# Patient Record
Sex: Female | Born: 1990
Health system: Southern US, Community
[De-identification: ages and names within clinical notes are randomized; demographics above are authoritative.]

## PROBLEM LIST (undated history)

## (undated) DIAGNOSIS — K529 Noninfective gastroenteritis and colitis, unspecified: Secondary | ICD-10-CM

## (undated) DIAGNOSIS — F419 Anxiety disorder, unspecified: Secondary | ICD-10-CM

## (undated) DIAGNOSIS — G43909 Migraine, unspecified, not intractable, without status migrainosus: Secondary | ICD-10-CM

## (undated) DIAGNOSIS — G8929 Other chronic pain: Secondary | ICD-10-CM

## (undated) DIAGNOSIS — F909 Attention-deficit hyperactivity disorder, unspecified type: Secondary | ICD-10-CM

## (undated) DIAGNOSIS — K219 Gastro-esophageal reflux disease without esophagitis: Secondary | ICD-10-CM

## (undated) DIAGNOSIS — M1990 Unspecified osteoarthritis, unspecified site: Secondary | ICD-10-CM

## (undated) DIAGNOSIS — M419 Scoliosis, unspecified: Secondary | ICD-10-CM

## (undated) DIAGNOSIS — Z9889 Other specified postprocedural states: Secondary | ICD-10-CM

## (undated) DIAGNOSIS — D649 Anemia, unspecified: Secondary | ICD-10-CM

## (undated) DIAGNOSIS — R112 Nausea with vomiting, unspecified: Secondary | ICD-10-CM

## (undated) HISTORY — DX: Noninfective gastroenteritis and colitis, unspecified: K52.9

## (undated) HISTORY — DX: Other chronic pain: G89.29

## (undated) HISTORY — PX: BREAST SURGERY: SHX581

---
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
## 2012-07-09 ENCOUNTER — Emergency Department: Payer: Self-pay | Admitting: Emergency Medicine

## 2012-07-09 LAB — URINALYSIS, COMPLETE
Bilirubin,UR: NEGATIVE
Glucose,UR: NEGATIVE mg/dL (ref 0–75)
RBC,UR: 2 /HPF (ref 0–5)
Squamous Epithelial: 2
WBC UR: 6 /HPF (ref 0–5)

## 2012-07-09 LAB — CBC
MCH: 28.7 pg (ref 26.0–34.0)
MCHC: 32.6 g/dL (ref 32.0–36.0)
MCV: 88 fL (ref 80–100)
Platelet: 264 10*3/uL (ref 150–440)
RDW: 14.1 % (ref 11.5–14.5)
WBC: 14.3 10*3/uL — ABNORMAL HIGH (ref 3.6–11.0)

## 2012-07-09 LAB — COMPREHENSIVE METABOLIC PANEL
Albumin: 4 g/dL (ref 3.4–5.0)
Anion Gap: 5 — ABNORMAL LOW (ref 7–16)
Calcium, Total: 8.8 mg/dL (ref 8.5–10.1)
Chloride: 106 mmol/L (ref 98–107)
Co2: 28 mmol/L (ref 21–32)
EGFR (African American): >60
EGFR (Non-African Amer.): >60
Osmolality: 276 (ref 275–301)
Potassium: 3.2 mmol/L — ABNORMAL LOW (ref 3.5–5.1)
Sodium: 139 mmol/L (ref 136–145)

## 2012-07-09 LAB — PREGNANCY, URINE: Pregnancy Test, Urine: NEGATIVE m[IU]/mL

## 2012-07-09 LAB — MAGNESIUM: Magnesium: 2.1 mg/dL

## 2012-07-15 LAB — CULTURE, BLOOD (SINGLE)

## 2015-07-05 ENCOUNTER — Other Ambulatory Visit: Payer: Self-pay

## 2015-07-05 ENCOUNTER — Emergency Department (HOSPITAL_COMMUNITY)
Admission: EM | Admit: 2015-07-05 | Discharge: 2015-07-05 | Discharge status: Against Medical Advice | Payer: Medicaid Other | Attending: Emergency Medicine | Admitting: Emergency Medicine

## 2015-07-05 ENCOUNTER — Encounter (HOSPITAL_COMMUNITY): Payer: Self-pay | Admitting: Emergency Medicine

## 2015-07-05 DIAGNOSIS — Y939 Activity, unspecified: Secondary | ICD-10-CM | POA: Insufficient documentation

## 2015-07-05 DIAGNOSIS — Y929 Unspecified place or not applicable: Secondary | ICD-10-CM | POA: Diagnosis not present

## 2015-07-05 DIAGNOSIS — Y999 Unspecified external cause status: Secondary | ICD-10-CM | POA: Insufficient documentation

## 2015-07-05 DIAGNOSIS — T50901A Poisoning by unspecified drugs, medicaments and biological substances, accidental (unintentional), initial encounter: Secondary | ICD-10-CM | POA: Insufficient documentation

## 2015-07-05 HISTORY — DX: Attention-deficit hyperactivity disorder, unspecified type: F90.9

## 2016-02-16 ENCOUNTER — Encounter (HOSPITAL_COMMUNITY): Payer: Self-pay | Admitting: Emergency Medicine

## 2016-02-16 ENCOUNTER — Emergency Department (HOSPITAL_COMMUNITY): Payer: Medicaid Other

## 2016-02-16 ENCOUNTER — Emergency Department (HOSPITAL_COMMUNITY)
Admission: EM | Admit: 2016-02-16 | Discharge: 2016-02-16 | Disposition: A | Discharge status: Home / Self Care | Payer: Medicaid Other | Attending: Emergency Medicine | Admitting: Emergency Medicine

## 2016-02-16 DIAGNOSIS — Z8659 Personal history of other mental and behavioral disorders: Secondary | ICD-10-CM | POA: Insufficient documentation

## 2016-02-16 DIAGNOSIS — R102 Pelvic and perineal pain: Secondary | ICD-10-CM

## 2016-02-16 DIAGNOSIS — N83201 Unspecified ovarian cyst, right side: Secondary | ICD-10-CM | POA: Diagnosis not present

## 2016-02-16 DIAGNOSIS — R197 Diarrhea, unspecified: Secondary | ICD-10-CM | POA: Diagnosis not present

## 2016-02-16 DIAGNOSIS — Z3202 Encounter for pregnancy test, result negative: Secondary | ICD-10-CM | POA: Insufficient documentation

## 2016-02-16 DIAGNOSIS — M419 Scoliosis, unspecified: Secondary | ICD-10-CM | POA: Insufficient documentation

## 2016-02-16 DIAGNOSIS — R1031 Right lower quadrant pain: Secondary | ICD-10-CM | POA: Diagnosis present

## 2016-02-16 HISTORY — DX: Unspecified osteoarthritis, unspecified site: M19.90

## 2016-02-16 HISTORY — DX: Scoliosis, unspecified: M41.9

## 2016-02-16 LAB — URINALYSIS, ROUTINE W REFLEX MICROSCOPIC
Bilirubin Urine: NEGATIVE
GLUCOSE, UA: NEGATIVE mg/dL
Hgb urine dipstick: NEGATIVE
Ketones, ur: NEGATIVE mg/dL
LEUKOCYTES UA: NEGATIVE
Nitrite: NEGATIVE
PH: 8.5 — AB (ref 5.0–8.0)
Protein, ur: NEGATIVE mg/dL
SPECIFIC GRAVITY, URINE: 1.02 (ref 1.005–1.030)

## 2016-02-16 LAB — CBC
HEMATOCRIT: 39.6 % (ref 36.0–46.0)
Hemoglobin: 12.6 g/dL (ref 12.0–15.0)
MCH: 28.7 pg (ref 26.0–34.0)
MCHC: 31.8 g/dL (ref 30.0–36.0)
MCV: 90.2 fL (ref 78.0–100.0)
Platelets: 241 10*3/uL (ref 150–400)
RBC: 4.39 MIL/uL (ref 3.87–5.11)
RDW: 13.8 % (ref 11.5–15.5)
WBC: 5.2 10*3/uL (ref 4.0–10.5)

## 2016-02-16 LAB — COMPREHENSIVE METABOLIC PANEL
ALT: 11 U/L — ABNORMAL LOW (ref 14–54)
AST: 17 U/L (ref 15–41)
Albumin: 4.3 g/dL (ref 3.5–5.0)
Alkaline Phosphatase: 38 U/L (ref 38–126)
Anion gap: 10 (ref 5–15)
BUN: 10 mg/dL (ref 6–20)
CHLORIDE: 106 mmol/L (ref 101–111)
CO2: 26 mmol/L (ref 22–32)
Calcium: 9.3 mg/dL (ref 8.9–10.3)
Creatinine, Ser: 0.77 mg/dL (ref 0.44–1.00)
Glucose, Bld: 49 mg/dL — ABNORMAL LOW (ref 65–99)
POTASSIUM: 3.4 mmol/L — AB (ref 3.5–5.1)
SODIUM: 142 mmol/L (ref 135–145)
Total Bilirubin: 0.8 mg/dL (ref 0.3–1.2)
Total Protein: 7.4 g/dL (ref 6.5–8.1)

## 2016-02-16 LAB — CBG MONITORING, ED: Glucose-Capillary: 186 mg/dL — ABNORMAL HIGH (ref 65–99)

## 2016-02-16 LAB — I-STAT BETA HCG BLOOD, ED (MC, WL, AP ONLY): I-stat hCG, quantitative: <5 m[IU]/mL (ref <5)

## 2016-02-16 LAB — LIPASE, BLOOD: LIPASE: 35 U/L (ref 11–51)

## 2016-02-16 MED ORDER — HYDROMORPHONE HCL 1 MG/ML IJ SOLN
1.0000 mg | Freq: Once | INTRAMUSCULAR | Status: AC
  Administered 2016-02-16: 1 mg via INTRAVENOUS
  Filled 2016-02-16: qty 1

## 2016-02-16 MED ORDER — SODIUM CHLORIDE 0.9 % IV BOLUS (SEPSIS)
1000.0000 mL | Freq: Once | INTRAVENOUS | Status: AC
  Administered 2016-02-16: 1000 mL via INTRAVENOUS

## 2016-02-16 MED ORDER — MORPHINE SULFATE (PF) 4 MG/ML IV SOLN
4.0000 mg | Freq: Once | INTRAVENOUS | Status: AC
  Administered 2016-02-16: 4 mg via INTRAVENOUS
  Filled 2016-02-16: qty 1

## 2016-02-16 MED ORDER — IOPAMIDOL (ISOVUE-300) INJECTION 61%
INTRAVENOUS | Status: AC
  Administered 2016-02-16: 80 mL
  Filled 2016-02-16: qty 100

## 2016-02-16 MED ORDER — SODIUM CHLORIDE 0.9 % IV BOLUS (SEPSIS): 1000.0000 mL | Freq: Once | INTRAVENOUS | Status: DC

## 2016-02-16 MED ORDER — DOXYCYCLINE HYCLATE 100 MG PO CAPS: 100.0000 mg | ORAL_CAPSULE | Freq: Two times a day (BID) | ORAL | Status: DC

## 2016-02-16 MED ORDER — IOPAMIDOL (ISOVUE-370) INJECTION 76%
INTRAVENOUS | Status: AC
  Filled 2016-02-16: qty 100

## 2016-02-16 MED ORDER — DOXYCYCLINE HYCLATE 100 MG PO TABS
100.0000 mg | ORAL_TABLET | Freq: Once | ORAL | Status: AC
  Administered 2016-02-16: 100 mg via ORAL
  Filled 2016-02-16: qty 1

## 2016-02-16 MED ORDER — DEXTROSE 5 % IV BOLUS: 1000.0000 mL | Freq: Once | INTRAVENOUS | Status: DC

## 2016-02-16 MED ORDER — OXYCODONE-ACETAMINOPHEN 5-325 MG PO TABS: 1.0000 | ORAL_TABLET | Freq: Four times a day (QID) | ORAL | Status: DC | PRN

## 2016-02-16 MED ORDER — DIATRIZOATE MEGLUMINE & SODIUM 66-10 % PO SOLN
ORAL | Status: AC
Start: 2016-02-16 — End: 2016-02-16
  Administered 2016-02-16: 30 mL
  Filled 2016-02-16: qty 30

## 2016-02-16 MED ORDER — DEXTROSE 50 % IV SOLN
1.0000 | Freq: Once | INTRAVENOUS | Status: AC
  Administered 2016-02-16: 50 mL via INTRAVENOUS
  Filled 2016-02-16: qty 50

## 2016-02-16 MED ORDER — KETOROLAC TROMETHAMINE 30 MG/ML IJ SOLN
30.0000 mg | Freq: Once | INTRAMUSCULAR | Status: AC
  Administered 2016-02-16: 30 mg via INTRAVENOUS
  Filled 2016-02-16: qty 1

## 2016-02-16 NOTE — ED Provider Notes (Signed)
Medical screening examination/treatment/procedure(s) were conducted as a shared visit with non-physician practitioner(s) and myself.  I personally evaluated the patient during the encounter.   EKG Interpretation None      25 year old female who presents with right lower quadrant abdominal pain. History of ADHD and no prior abdominal surgeries. One week ago noticed a sharp pain in her right lower quadrant that self resolved. Over the past 2 days she has had increasing episodes of sharp intermittent right lower quadrant abdominal pain. No nausea or vomiting, diarrhea, fevers or chills. Vital signs within normal limits on arrival. She is a soft and nonsurgical abdomen with exquisite tenderness in the right adnexa. CT abdomen and pelvis was initially performed revealing 5 x 5 x 5 cm right ovarian cyst. she will undergo pelvic ultrasound with Doppler to evaluate for torsion. If pain well controlled and no concern for torsion may be discharged with gynecology follow-up.   Lavera Guiseana Duo Jameil Whitmoyer, MD 02/16/16 [phone]

## 2016-02-16 NOTE — ED Provider Notes (Signed)
CSN: [id number]     Arrival date & time 02/16/16  1209 History   First MD Initiated Contact with Patient 02/16/16 1522     Chief Complaint  Patient presents with  . Abdominal Pain   HPI Comments: 25 year old female who presents with right lower quadrant abdominal pain for the past week. The pain is constant, aching, worse with movement. She reports associated body chills, nausea, vomiting, diarrhea. Denies fever, chest pain, shortness of breath, blood in stool or urine, dysuria, vaginal discharge. She is currently on birth control, uses Nexplanon. Has regular periods. Denies previous abdominal surgeries. Patient has developmental delays and is unable to provide an accurate history.   Past Medical History  Diagnosis Date  . ADHD (attention deficit hyperactivity disorder)   . Arthritis   . Scoliosis    Past Surgical History  Procedure Laterality Date  . Breast surgery Right     Cyst removal   History reviewed. No pertinent family history. Social History  Substance Use Topics  . Smoking status: Never Smoker   . Smokeless tobacco: None  . Alcohol Use: No   OB History    No data available     Review of Systems  Constitutional: Positive for chills and appetite change. Negative for fever.  Gastrointestinal: Positive for nausea, vomiting, abdominal pain and diarrhea. Negative for constipation.  Genitourinary: Negative for dysuria, vaginal bleeding, vaginal discharge and vaginal pain.  All other systems reviewed and are negative.  Allergies  Review of patient's allergies indicates no known allergies.  Home Medications   Prior to Admission medications   Not on File   BP 121/69 mmHg  Pulse 81  Temp(Src) 98.3 F (36.8 C) (Oral)  Resp 17  SpO2 99%  LMP 02/09/2016 (Approximate)   Physical Exam  Constitutional: She is oriented to person, place, and time. She appears well-developed and well-nourished. No distress.  HENT:  Head: Normocephalic and atraumatic.  Eyes:  Conjunctivae are normal. Pupils are equal, round, and reactive to light. Right eye exhibits no discharge. Left eye exhibits no discharge. No scleral icterus.  Neck: Normal range of motion.  Cardiovascular: Normal rate and regular rhythm.  Exam reveals no gallop and no friction rub.   No murmur heard. Pulmonary/Chest: Effort normal. No respiratory distress. She has no wheezes. She has no rales. She exhibits no tenderness.  Abdominal: Soft. She exhibits no distension and no mass. There is tenderness. There is guarding. There is no rebound.  Marked tenderness over suprapubic and RLQ  Neurological: She is alert and oriented to person, place, and time.  Skin: Skin is warm and dry.  Psychiatric: She has a normal mood and affect.    ED Course  Procedures (including critical care time) Labs Review Labs Reviewed  COMPREHENSIVE METABOLIC PANEL - Abnormal; Notable for the following:    Potassium 3.4 (*)    Glucose, Bld 49 (*)    ALT 11 (*)    All other components within normal limits  URINALYSIS, ROUTINE W REFLEX MICROSCOPIC (NOT AT [facility]) - Abnormal; Notable for the following:    APPearance CLOUDY (*)    pH 8.5 (*)    All other components within normal limits  LIPASE, BLOOD  CBC  I-STAT BETA HCG BLOOD, ED ([facility], [facility], [facility] ONLY)  CBG MONITORING, ED    Imaging Review Ct Abdomen Pelvis W Contrast  02/16/2016  CLINICAL DATA:  Ight lower quadrant pain with intermittent vomiting for 2 days EXAM: CT ABDOMEN AND PELVIS WITH CONTRAST TECHNIQUE: Multidetector CT imaging  of the abdomen and pelvis was performed using the standard protocol following bolus administration of intravenous contrast. Oral contrast was also administered. CONTRAST:  80mL ISOVUE-300 IOPAMIDOL (ISOVUE-300) INJECTION 61% COMPARISON:  Ultrasound pelvis July 09, 2012 FINDINGS: Lower chest:  Lung bases are clear. Hepatobiliary: No focal liver lesions are identified. Gallbladder wall is not appreciably thickened. There is no biliary duct  dilatation. Pancreas: There is no pancreatic mass or inflammatory focus. Spleen: No splenic lesions are evident. Adrenals/Urinary Tract: No adrenal lesions are identified. Kidneys bilaterally show no mass or hydronephrosis on either side. There is no renal or ureteral calculus on either side. Urinary bladder is midline with wall thickness within normal limits. Stomach/Bowel: There is no bowel wall or mesenteric thickening. No bowel obstruction. No free air or portal venous air. Rectum is mildly distended with stool and air. Vascular/Lymphatic: There is no abdominal aortic aneurysm. No vascular lesions are evident. There is no adenopathy in the abdomen or pelvis. Reproductive: The uterus is anteverted. There is a predominantly cystic right adnexal mass measuring 5.6 x 5.1 x 5.3 cm. There is layering debris in this cystic lesion. The wall of this cystic lesion is not appreciably thickened. There is no other pelvic mass. Other: Appendix appears unremarkable. There is no ascites or abscess in the abdomen or pelvis. Musculoskeletal: There is thoracolumbar dextroscoliosis. There are no blastic or lytic bone lesions. There is no intramuscular or abdominal wall lesion. IMPRESSION: Right adnexal cystic lesion with layering debris, measuring 5.6 x 5.1 x 5.3 cm. Question hemorrhagic cyst versus cyst with infection. No other pelvic mass. Note that the patient had a right ovarian cyst in 2013. Question whether this cyst represents the same cyst, now with layering debris versus a new cystic lesion in this area compared to the prior study. With respect to the current right adnexal complex cystic lesion, ultrasound in 6-12 weeks to further evaluate this lesion would be warranted. Timing of ultrasound ideally would be at the end of the patient's menstrual cycle. Appendix region appears normal. No bowel obstruction. No renal or ureteral calculus. No hydronephrosis. Electronically Signed   By: Bretta Bang III M.D.   On:  02/16/2016 19:38   I have personally reviewed and evaluated these images and lab results as part of my medical decision-making.   EKG Interpretation None     Meds given in ED:  Medications  HYDROmorphone (DILAUDID) injection 1 mg (not administered)  dextrose 50 % solution 50 mL (not administered)  morphine 4 MG/ML injection 4 mg (4 mg Intravenous Given 02/16/16 1613)  sodium chloride 0.9 % bolus 1,000 mL (0 mLs Intravenous Stopped 02/16/16 1750)  diatrizoate meglumine-sodium (GASTROGRAFIN) 66-10 % solution (30 mLs  Given 02/16/16 1609)  HYDROmorphone (DILAUDID) injection 1 mg (1 mg Intravenous Given 02/16/16 1751)  iopamidol (ISOVUE-300) 61 % injection (80 mLs  Contrast Given 02/16/16 1911)    New Prescriptions   No medications on file     MDM   Final diagnoses:  None   25 year old female who presents with 1 week of RLQ abdominal pain. Ct of abdomen is showing a right adnexal cyst which is 5cm x 5cm x 5cm.  IVF and Morphine given which has provided minimal pain relief.  Dilaudid given x 2 which has provided slightly more relief. Due to her pain being relatively uncontrolled with pain meds, will get pelvic US with doppler to r/o Torsion. Labs show Glucose 49. Will give amp of D50. Otherwise labs are unremarkable.   Patient is  afebrile, NAD, non-toxic. Shared visit with Dr. Verdie Mosher. Will sign out to [employer] PA-C    Bethel Born, PA-C 02/16/16 2051  Lavera Guise, MD 02/16/16 [phone]

## 2016-02-16 NOTE — ED Provider Notes (Signed)
Patient to the ER with complaints of RLQ abdominal pain that has been persisting for 1 week, over the past two days it has gotten much worse. Per mom the patient has a low IQ.  She has seen originally by Terance Hart, PA-C and signed out at shift change.  Labs Reviewed  COMPREHENSIVE METABOLIC PANEL - Abnormal; Notable for the following:    Potassium 3.4 (*)    Glucose, Bld 49 (*)    ALT 11 (*)    All other components within normal limits  URINALYSIS, ROUTINE W REFLEX MICROSCOPIC (NOT AT [facility]) - Abnormal; Notable for the following:    APPearance CLOUDY (*)    pH 8.5 (*)    All other components within normal limits  CBG MONITORING, ED - Abnormal; Notable for the following:    Glucose-Capillary 186 (*)    All other components within normal limits  LIPASE, BLOOD  CBC  I-STAT BETA HCG BLOOD, ED ([facility], [facility], [facility] ONLY)       US Pelvis Complete (Final result) Result time: 02/16/16 21:13:03   Final result by Rad Results In Interface (02/16/16 21:13:03)   Narrative:   CLINICAL DATA: Right-sided pelvic pain for 2 days. Abnormal CT.  EXAM: TRANSABDOMINAL ULTRASOUND OF PELVIS  DOPPLER ULTRASOUND OF OVARIES  TECHNIQUE: Transabdominal ultrasound examination of the pelvis was performed including evaluation of the uterus, ovaries, adnexal regions, and pelvic cul-de-sac.  Color and duplex Doppler ultrasound was utilized to evaluate blood flow to the ovaries.  COMPARISON: CT 02/16/2016  FINDINGS: Uterus  Measurements: 7.9 x 4.0 x 5.4 cm. No fibroids or other mass visualized.  Endometrium  Thickness: 4.2 mm. No focal abnormality visualized.  Right ovary  Measurements: 5.5 x 4.5 x 5.5 cm. There is a complex 4.8 cm cyst with fluid fluid level, corresponding to the CT abnormality. This probably is hemorrhagic. No solid adnexal lesions are evident. There is no free pelvic fluid.  Left ovary  Not visible  Pulsed Doppler evaluation demonstrates normal low-resistance arterial and  venous waveforms in both ovaries.  IMPRESSION: 4.8 cm right adnexal cyst with fluid fluid level, likely hemorrhagic. Nonvisualization of the left ovary. With regard to the right ovarian lesion, recommend 6-12 week follow-up ultrasound to ensure resolution.   Electronically Signed By: Ellery Plunk M.D. On: 02/16/2016 21:13          [place] Art/Ven Flow Abd Pelv Doppler Limited (Final result) Result time: 02/16/16 21:13:03   Final result by Rad Results In Interface (02/16/16 21:13:03)   Narrative:   CLINICAL DATA: Right-sided pelvic pain for 2 days. Abnormal CT.  EXAM: TRANSABDOMINAL ULTRASOUND OF PELVIS  DOPPLER ULTRASOUND OF OVARIES  TECHNIQUE: Transabdominal ultrasound examination of the pelvis was performed including evaluation of the uterus, ovaries, adnexal regions, and pelvic cul-de-sac.  Color and duplex Doppler ultrasound was utilized to evaluate blood flow to the ovaries.  COMPARISON: CT 02/16/2016  FINDINGS: Uterus  Measurements: 7.9 x 4.0 x 5.4 cm. No fibroids or other mass visualized.  Endometrium  Thickness: 4.2 mm. No focal abnormality visualized.  Right ovary  Measurements: 5.5 x 4.5 x 5.5 cm. There is a complex 4.8 cm cyst with fluid fluid level, corresponding to the CT abnormality. This probably is hemorrhagic. No solid adnexal lesions are evident. There is no free pelvic fluid.  Left ovary  Not visible  Pulsed Doppler evaluation demonstrates normal low-resistance arterial and venous waveforms in both ovaries.  IMPRESSION: 4.8 cm right adnexal cyst with fluid fluid level, likely hemorrhagic. Nonvisualization of the  left ovary. With regard to the right ovarian lesion, recommend 6-12 week follow-up ultrasound to ensure resolution.   Electronically Signed By: Ellery Plunk M.D. On: 02/16/2016 21:13          CT Abdomen Pelvis W Contrast (Final result) Result time: 02/16/16 19:38:29   Final result by Rad  Results In Interface (02/16/16 19:38:29)   Narrative:   CLINICAL DATA: Ight lower quadrant pain with intermittent vomiting for 2 days  EXAM: CT ABDOMEN AND PELVIS WITH CONTRAST  TECHNIQUE: Multidetector CT imaging of the abdomen and pelvis was performed using the standard protocol following bolus administration of intravenous contrast. Oral contrast was also administered.  CONTRAST: 80mL ISOVUE-300 IOPAMIDOL (ISOVUE-300) INJECTION 61%  COMPARISON: Ultrasound pelvis July 09, 2012  FINDINGS: Lower chest: Lung bases are clear.  Hepatobiliary: No focal liver lesions are identified. Gallbladder wall is not appreciably thickened. There is no biliary duct dilatation.  Pancreas: There is no pancreatic mass or inflammatory focus.  Spleen: No splenic lesions are evident.  Adrenals/Urinary Tract: No adrenal lesions are identified. Kidneys bilaterally show no mass or hydronephrosis on either side. There is no renal or ureteral calculus on either side. Urinary bladder is midline with wall thickness within normal limits.  Stomach/Bowel: There is no bowel wall or mesenteric thickening. No bowel obstruction. No free air or portal venous air. Rectum is mildly distended with stool and air.  Vascular/Lymphatic: There is no abdominal aortic aneurysm. No vascular lesions are evident. There is no adenopathy in the abdomen or pelvis.  Reproductive: The uterus is anteverted. There is a predominantly cystic right adnexal mass measuring 5.6 x 5.1 x 5.3 cm. There is layering debris in this cystic lesion. The wall of this cystic lesion is not appreciably thickened. There is no other pelvic mass.  Other: Appendix appears unremarkable. There is no ascites or abscess in the abdomen or pelvis.  Musculoskeletal: There is thoracolumbar dextroscoliosis. There are no blastic or lytic bone lesions. There is no intramuscular or abdominal wall lesion.  IMPRESSION: Right adnexal cystic lesion  with layering debris, measuring 5.6 x 5.1 x 5.3 cm. Question hemorrhagic cyst versus cyst with infection. No other pelvic mass. Note that the patient had a right ovarian cyst in 2013. Question whether this cyst represents the same cyst, now with layering debris versus a new cystic lesion in this area compared to the prior study.  With respect to the current right adnexal complex cystic lesion, ultrasound in 6-12 weeks to further evaluate this lesion would be warranted. Timing of ultrasound ideally would be at the end of the patient's menstrual cycle.  Appendix region appears normal. No bowel obstruction. No renal or ureteral calculus. No hydronephrosis.   Electronically Signed By: Bretta Bang III M.D. On: 02/16/2016 19:38        ECG Results    None     Her CT abd/pelv showed abnormalities to the ovaries but otherwise normal. A US duplex of the ovaries for further evaluation was ordered. The US shows Right adnexal cystic lesion with layering debris, measuring 5.6 x 5.1 x 5.3 cm.  4.8 cm right adnexal cyst with fluid fluid level, likely hemorrhagic. Nonvisualization of the left ovary. With regard to the right ovarian lesion, recommend 6-12 week follow-up ultrasound to ensure resolution.  A pelvic exam was not done during this patients encounter and the mom now refuses as she says she would like to go home because it has been a long day. CT scan had mentioned concerned for possible infectious  etiology. Per the patients symptoms and US this does not seem consistent with infection. However, will start her on doxycyline and pain medication and refer to womens outpatient clinic.  Strict return precautions given.  Marlon Peliffany Omaria Plunk, PA-C 02/16/16 2130  Lavera Guiseana Duo Liu, MD 02/16/16 [phone]

## 2016-02-16 NOTE — Discharge Instructions (Signed)
Ovarian Cyst An ovarian cyst is a fluid-filled sac that forms on an ovary. The ovaries are small organs that produce eggs in women. Various types of cysts can form on the ovaries. Most are not cancerous. Many do not cause problems, and they often go away on their own. Some may cause symptoms and require treatment. Common types of ovarian cysts include:  Functional cysts--These cysts may occur every month during the menstrual cycle. This is normal. The cysts usually go away with the next menstrual cycle if the woman does not get pregnant. Usually, there are no symptoms with a functional cyst.  Endometrioma cysts--These cysts form from the tissue that lines the uterus. They are also called "chocolate cysts" because they become filled with blood that turns brown. This type of cyst can cause pain in the lower abdomen during intercourse and with your menstrual period.  Cystadenoma cysts--This type develops from the cells on the outside of the ovary. These cysts can get very big and cause lower abdomen pain and pain with intercourse. This type of cyst can twist on itself, cut off its blood supply, and cause severe pain. It can also easily rupture and cause a lot of pain.  Dermoid cysts--This type of cyst is sometimes found in both ovaries. These cysts may contain different kinds of body tissue, such as skin, teeth, hair, or cartilage. They usually do not cause symptoms unless they get very big.  Theca lutein cysts--These cysts occur when too much of a certain hormone (human chorionic gonadotropin) is produced and overstimulates the ovaries to produce an egg. This is most common after procedures used to assist with the conception of a baby (in vitro fertilization). CAUSES   Fertility drugs can cause a condition in which multiple large cysts are formed on the ovaries. This is called ovarian hyperstimulation syndrome.  A condition called polycystic ovary syndrome can cause hormonal imbalances that can lead to  nonfunctional ovarian cysts. SIGNS AND SYMPTOMS  Many ovarian cysts do not cause symptoms. If symptoms are present, they may include:  Pelvic pain or pressure.  Pain in the lower abdomen.  Pain during sexual intercourse.  Increasing girth (swelling) of the abdomen.  Abnormal menstrual periods.  Increasing pain with menstrual periods.  Stopping having menstrual periods without being pregnant. DIAGNOSIS  These cysts are commonly found during a routine or annual pelvic exam. Tests may be ordered to find out more about the cyst. These tests may include:  Ultrasound.  X-ray of the pelvis.  CT scan.  MRI.  Blood tests. TREATMENT  Many ovarian cysts go away on their own without treatment. Your health care provider may want to check your cyst regularly for 2-3 months to see if it changes. For women in menopause, it is particularly important to monitor a cyst closely because of the higher rate of ovarian cancer in menopausal women. When treatment is needed, it may include any of the following:  A procedure to drain the cyst (aspiration). This may be done using a long needle and ultrasound. It can also be done through a laparoscopic procedure. This involves using a thin, lighted tube with a tiny camera on the end (laparoscope) inserted through a small incision.  Surgery to remove the whole cyst. This may be done using laparoscopic surgery or an open surgery involving a larger incision in the lower abdomen.  Hormone treatment or birth control pills. These methods are sometimes used to help dissolve a cyst. HOME CARE INSTRUCTIONS   Only take over-the-counter   or prescription medicines as directed by your health care provider.  Follow up with your health care provider as directed.  Get regular pelvic exams and Pap tests. SEEK MEDICAL CARE IF:   Your periods are late, irregular, or painful, or they stop.  Your pelvic pain or abdominal pain does not go away.  Your abdomen becomes  larger or swollen.  You have pressure on your bladder or trouble emptying your bladder completely.  You have pain during sexual intercourse.  You have feelings of fullness, pressure, or discomfort in your stomach.  You lose weight for no apparent reason.  You feel generally ill.  You become constipated.  You lose your appetite.  You develop acne.  You have an increase in body and facial hair.  You are gaining weight, without changing your exercise and eating habits.  You think you are pregnant. SEEK IMMEDIATE MEDICAL CARE IF:   You have increasing abdominal pain.  You feel sick to your stomach (nauseous), and you throw up (vomit).  You develop a fever that comes on suddenly.  You have abdominal pain during a bowel movement.  Your menstrual periods become heavier than usual. MAKE SURE YOU:  Understand these instructions.  Will watch your condition.  Will get help right away if you are not doing well or get worse.   This information is not intended to replace advice given to you by your health care provider. Make sure you discuss any questions you have with your health care provider.   Document Released: 10/25/2005 Document Revised: 10/30/2013 Document Reviewed: 07/02/2013 Elsevier Interactive Patient Education 2016 [employer]  

## 2016-02-16 NOTE — ED Notes (Signed)
Pt with RLQ pain x 2 days that is worse with movement

## 2017-01-27 ENCOUNTER — Emergency Department: Payer: Medicaid Other

## 2017-01-27 ENCOUNTER — Emergency Department
Admission: EM | Admit: 2017-01-27 | Discharge: 2017-01-28 | Disposition: A | Discharge status: Home / Self Care | Payer: Medicaid Other | Attending: Emergency Medicine | Admitting: Emergency Medicine

## 2017-01-27 DIAGNOSIS — Z79899 Other long term (current) drug therapy: Secondary | ICD-10-CM | POA: Diagnosis not present

## 2017-01-27 DIAGNOSIS — F909 Attention-deficit hyperactivity disorder, unspecified type: Secondary | ICD-10-CM | POA: Diagnosis not present

## 2017-01-27 DIAGNOSIS — R1032 Left lower quadrant pain: Secondary | ICD-10-CM | POA: Insufficient documentation

## 2017-01-27 DIAGNOSIS — Z7982 Long term (current) use of aspirin: Secondary | ICD-10-CM | POA: Insufficient documentation

## 2017-01-27 DIAGNOSIS — R112 Nausea with vomiting, unspecified: Secondary | ICD-10-CM | POA: Insufficient documentation

## 2017-01-27 LAB — URINALYSIS, COMPLETE (UACMP) WITH MICROSCOPIC
Bilirubin Urine: NEGATIVE
Glucose, UA: NEGATIVE mg/dL
Hgb urine dipstick: NEGATIVE
KETONES UR: NEGATIVE mg/dL
LEUKOCYTES UA: NEGATIVE
Nitrite: NEGATIVE
PH: 6 (ref 5.0–8.0)
Protein, ur: NEGATIVE mg/dL
SPECIFIC GRAVITY, URINE: 1.017 (ref 1.005–1.030)

## 2017-01-27 LAB — BASIC METABOLIC PANEL
Anion gap: 7 (ref 5–15)
BUN: 12 mg/dL (ref 6–20)
CALCIUM: 10.2 mg/dL (ref 8.9–10.3)
CO2: 25 mmol/L (ref 22–32)
CREATININE: 0.63 mg/dL (ref 0.44–1.00)
Chloride: 106 mmol/L (ref 101–111)
GFR calc Af Amer: >60 mL/min (ref >60)
GFR calc non Af Amer: >60 mL/min (ref >60)
GLUCOSE: 117 mg/dL — AB (ref 65–99)
Potassium: 3.4 mmol/L — ABNORMAL LOW (ref 3.5–5.1)
Sodium: 138 mmol/L (ref 135–145)

## 2017-01-27 LAB — POCT PREGNANCY, URINE: Preg Test, Ur: NEGATIVE

## 2017-01-27 LAB — CBC
HCT: 37.6 % (ref 35.0–47.0)
Hemoglobin: 12.4 g/dL (ref 12.0–16.0)
MCH: 28.1 pg (ref 26.0–34.0)
MCHC: 33.1 g/dL (ref 32.0–36.0)
MCV: 85 fL (ref 80.0–100.0)
Platelets: 267 10*3/uL (ref 150–440)
RBC: 4.43 MIL/uL (ref 3.80–5.20)
RDW: 14.6 % — AB (ref 11.5–14.5)
WBC: 8.8 10*3/uL (ref 3.6–11.0)

## 2017-01-27 MED ORDER — OXYCODONE-ACETAMINOPHEN 5-325 MG PO TABS
2.0000 | ORAL_TABLET | Freq: Once | ORAL | Status: AC
  Administered 2017-01-27: 2 via ORAL
  Filled 2017-01-27: qty 2

## 2017-01-27 NOTE — ED Triage Notes (Signed)
Pt in with co left sided abd pain that radiates to left leg for few weeks. States worse at night and symptoms worsened since yest. Also co urinary frequency and burning.

## 2017-01-27 NOTE — ED Provider Notes (Signed)
[facility] Emergency Department Provider Note  ____________________________________________   First MD Initiated Contact with Patient 01/27/17 2257     (approximate)  I have reviewed the triage vital signs and the nursing notes.   HISTORY  Chief Complaint Abdominal Pain  Level 5 caveat:  vague historian  HPI Regina [name] is a 26 y.o. female who has a past history of complex and hemorrhagic ovarian cysts who presents for evaluation of acute onset left lower abdominal pain that radiates down to her left leg.  Initially she and her mother state that this started a few weeks ago but then they decided that it was actually just within the last couple of days.  She states that it is worse at night and when she is lying flat.  There is some pain radiating down the back of her left thigh but she denies having lower back pain.  She denies vaginal bleeding and has not had a period for a long time since being on Implanon and now on oral birth control pills.  She says that "sometimes" she has some burning pain when she urinates.  She says that "sometimes" she throws up. She currently rates her pain as severe and describes it as both aching and sharp.  Nothing in particular makes it better and it is worse at night when lying flat.  He denies fever/chills, chest pain, shortness of breath, diarrhea, constipation.   Past Medical History:  Diagnosis Date  . ADHD (attention deficit hyperactivity disorder)   . Arthritis   . Scoliosis     There are no active problems to display for this patient.   Past Surgical History:  Procedure Laterality Date  . BREAST SURGERY Right    Cyst removal    Prior to Admission medications   Medication Sig Start Date End Date Taking? Authorizing Provider  Aspirin-Salicylamide-Caffeine (BC HEADACHE POWDER PO) Take 1 packet by mouth daily as needed. For pain   Yes Historical Provider, MD  CRYSELLE-28 0.3-30 MG-MCG tablet Take 1 tablet by  mouth daily.   Yes Historical Provider, MD  diphenhydrAMINE (SOMINEX) 25 MG tablet Take 25 mg by mouth at bedtime as needed for allergies or sleep.   Yes Historical Provider, MD  doxycycline (VIBRAMYCIN) 100 MG capsule Take 1 capsule (100 mg total) by mouth 2 (two) times daily. Patient not taking: Reported on 01/27/2017 02/16/16   Marlon Pel, PA-C  oxyCODONE-acetaminophen (PERCOCET/ROXICET) 5-325 MG tablet Take 1 tablet by mouth every 6 (six) hours as needed for severe pain. Patient not taking: Reported on 01/27/2017 02/16/16   Marlon Pel, PA-C    Allergies Patient has no known allergies.  No family history on file.  Social History Social History  Substance Use Topics  . Smoking status: Never Smoker  . Smokeless tobacco: Not on file  . Alcohol use No    Review of Systems Constitutional: No fever/chills Eyes: No visual changes. ENT: No sore throat. Cardiovascular: Denies chest pain. Respiratory: Denies shortness of breath. Gastrointestinal: LLQ abdominal pain radiating to LLE.  occasional nausea and vomiting.  No diarrhea.  No constipation. Genitourinary: Negative for dysuria.  No vaginal bleeding. Musculoskeletal: Negative for back pain. Skin: Negative for rash. Neurological: Negative for headaches, focal weakness or numbness.  10-point ROS otherwise negative.  ____________________________________________   PHYSICAL EXAM:  VITAL SIGNS: ED Triage Vitals  Enc Vitals Group     BP 01/27/17 2121 (!) 134/92     Pulse Rate 01/27/17 2121 90  Resp 01/27/17 2121 18     Temp 01/27/17 2121 98.9 F (37.2 C)     Temp Source 01/27/17 2121 Oral     SpO2 01/27/17 2121 100 %     Weight 01/27/17 2121 120 lb (54.4 kg)     Height 01/27/17 2121 5\' 9"  (1.753 m)     Head Circumference --      Peak Flow --      Pain Score 01/27/17 2122 10     Pain Loc --      Pain Edu? --      Excl. in GC? --     Constitutional: Alert and oriented. Well appearing and in no acute  distress. Eyes: Conjunctivae are normal. PERRL. EOMI. Head: Atraumatic. Nose: No congestion/rhinnorhea. Mouth/Throat: Mucous membranes are moist. Neck: No stridor.  No meningeal signs.   Cardiovascular: Normal rate, regular rhythm. Good peripheral circulation. Grossly normal heart sounds. Respiratory: Normal respiratory effort.  No retractions. Lungs CTAB. Gastrointestinal: Soft With very minimal tenderness to palpation of the left lower quadrant and left inguinal region.  No rebound and no guarding. Genitourinary: deferred due to patient preference Musculoskeletal: No lower extremity tenderness nor edema. No gross deformities of extremities. Neurologic:  Normal speech and language. No gross focal neurologic deficits are appreciated.  Skin:  Skin is warm, dry and intact. No rash noted. Psychiatric: Mood and affect are normal. Speech and behavior are normal.  ____________________________________________   LABS (all labs ordered are listed, but only abnormal results are displayed)  Labs Reviewed  CBC - Abnormal; Notable for the following:       Result Value   RDW 14.6 (*)    All other components within normal limits  BASIC METABOLIC PANEL - Abnormal; Notable for the following:    Potassium 3.4 (*)    Glucose, Bld 117 (*)    All other components within normal limits  URINALYSIS, COMPLETE (UACMP) WITH MICROSCOPIC - Abnormal; Notable for the following:    Color, Urine YELLOW (*)    APPearance CLEAR (*)    Bacteria, UA RARE (*)    Squamous Epithelial / LPF 0-5 (*)    All other components within normal limits  HEPATIC FUNCTION PANEL - Abnormal; Notable for the following:    Alkaline Phosphatase 29 (*)    Bilirubin, Direct <0.1 (*)    All other components within normal limits  LIPASE, BLOOD  POC URINE PREG, ED  POCT PREGNANCY, URINE   ____________________________________________  EKG  None - EKG not ordered by ED  physician ____________________________________________  RADIOLOGY   [place] Transvaginal Non-ob  Result Date: 01/28/2017 CLINICAL DATA:  Initial evaluation for acute left lower quadrant pain for 1-2 weeks. EXAM: TRANSABDOMINAL AND TRANSVAGINAL ULTRASOUND OF PELVIS DOPPLER ULTRASOUND OF OVARIES TECHNIQUE: Both transabdominal and transvaginal ultrasound examinations of the pelvis were performed. Transabdominal technique was performed for global imaging of the pelvis including uterus, ovaries, adnexal regions, and pelvic cul-de-sac. It was necessary to proceed with endovaginal exam following the transabdominal exam to visualize the uterus and ovaries. Color and duplex Doppler ultrasound was utilized to evaluate blood flow to the ovaries. COMPARISON:  Prior ultrasound 02/16/2016. FINDINGS: Uterus Measurements: 7.0 x 2.8 x 4.0 cm. No fibroids or other mass visualized. Endometrium Thickness: 5.6 mm.  No focal abnormality visualized. Right ovary Measurements: 2.4 x 1.3 x 1.7 cm. Normal appearance/no adnexal mass. Left ovary Measurements: 3.7 x 1.3 x 2.5 cm. Normal appearance/no adnexal mass. Pulsed Doppler evaluation of both ovaries demonstrates normal low-resistance arterial and venous  waveforms. Other findings No abnormal free fluid. IMPRESSION: Normal pelvic ultrasound. No acute abnormality identified. No evidence for torsion. Electronically Signed   By: Rise Mu M.D.   On: 01/28/2017 00:12   US Pelvis Complete  Result Date: 01/28/2017 CLINICAL DATA:  Initial evaluation for acute left lower quadrant pain for 1-2 weeks. EXAM: TRANSABDOMINAL AND TRANSVAGINAL ULTRASOUND OF PELVIS DOPPLER ULTRASOUND OF OVARIES TECHNIQUE: Both transabdominal and transvaginal ultrasound examinations of the pelvis were performed. Transabdominal technique was performed for global imaging of the pelvis including uterus, ovaries, adnexal regions, and pelvic cul-de-sac. It was necessary to proceed with endovaginal exam following  the transabdominal exam to visualize the uterus and ovaries. Color and duplex Doppler ultrasound was utilized to evaluate blood flow to the ovaries. COMPARISON:  Prior ultrasound 02/16/2016. FINDINGS: Uterus Measurements: 7.0 x 2.8 x 4.0 cm. No fibroids or other mass visualized. Endometrium Thickness: 5.6 mm.  No focal abnormality visualized. Right ovary Measurements: 2.4 x 1.3 x 1.7 cm. Normal appearance/no adnexal mass. Left ovary Measurements: 3.7 x 1.3 x 2.5 cm. Normal appearance/no adnexal mass. Pulsed Doppler evaluation of both ovaries demonstrates normal low-resistance arterial and venous waveforms. Other findings No abnormal free fluid. IMPRESSION: Normal pelvic ultrasound. No acute abnormality identified. No evidence for torsion. Electronically Signed   By: Rise Mu M.D.   On: 01/28/2017 00:12   Ct Abdomen Pelvis W Contrast  Result Date: 01/28/2017 CLINICAL DATA:  Subacute onset of left-sided abdominal pain, radiating to the left leg. Increased urinary frequency and burning. Initial encounter. EXAM: CT ABDOMEN AND PELVIS WITH CONTRAST TECHNIQUE: Multidetector CT imaging of the abdomen and pelvis was performed using the standard protocol following bolus administration of intravenous contrast. CONTRAST:  ISOVUE-300 IOPAMIDOL (ISOVUE-300) INJECTION 61% COMPARISON:  Pelvic ultrasound performed 01/27/2017, and CT of the abdomen and pelvis performed 02/16/2016 FINDINGS: Lower chest: The visualized lung bases are grossly clear. The visualized portions of the mediastinum are unremarkable. Hepatobiliary: The liver is unremarkable in appearance. The gallbladder is unremarkable in appearance. The common bile duct remains normal in caliber. Pancreas: The pancreas is within normal limits. Spleen: The spleen is unremarkable in appearance. Adrenals/Urinary Tract: The adrenal glands are unremarkable in appearance. The kidneys are within normal limits. There is no evidence of hydronephrosis. No renal  or ureteral stones are identified. No perinephric stranding is seen. Stomach/Bowel: The stomach is unremarkable in appearance. The small bowel is within normal limits. The appendix is normal in caliber, without evidence of appendicitis. The colon is partially filled with stool and is unremarkable in appearance. Vascular/Lymphatic: The abdominal aorta is unremarkable in appearance. The inferior vena cava is grossly unremarkable. No retroperitoneal lymphadenopathy is seen. No pelvic sidewall lymphadenopathy is identified. Reproductive: The bladder is mildly distended and within normal limits. The uterus is grossly unremarkable in appearance. The ovaries are relatively symmetric. No suspicious adnexal masses are seen. Other: No additional soft tissue abnormalities are seen. Musculoskeletal: No acute osseous abnormalities are identified. The visualized musculature is unremarkable in appearance. IMPRESSION: Unremarkable contrast-enhanced CT of the abdomen and pelvis. Electronically Signed   By: Roanna Raider M.D.   On: 01/28/2017 02:00   [place] Art/ven Flow Abd Pelv Doppler  Result Date: 01/28/2017 CLINICAL DATA:  Initial evaluation for acute left lower quadrant pain for 1-2 weeks. EXAM: TRANSABDOMINAL AND TRANSVAGINAL ULTRASOUND OF PELVIS DOPPLER ULTRASOUND OF OVARIES TECHNIQUE: Both transabdominal and transvaginal ultrasound examinations of the pelvis were performed. Transabdominal technique was performed for global imaging of the pelvis including uterus, ovaries, adnexal regions, and  pelvic cul-de-sac. It was necessary to proceed with endovaginal exam following the transabdominal exam to visualize the uterus and ovaries. Color and duplex Doppler ultrasound was utilized to evaluate blood flow to the ovaries. COMPARISON:  Prior ultrasound 02/16/2016. FINDINGS: Uterus Measurements: 7.0 x 2.8 x 4.0 cm. No fibroids or other mass visualized. Endometrium Thickness: 5.6 mm.  No focal abnormality visualized. Right ovary  Measurements: 2.4 x 1.3 x 1.7 cm. Normal appearance/no adnexal mass. Left ovary Measurements: 3.7 x 1.3 x 2.5 cm. Normal appearance/no adnexal mass. Pulsed Doppler evaluation of both ovaries demonstrates normal low-resistance arterial and venous waveforms. Other findings No abnormal free fluid. IMPRESSION: Normal pelvic ultrasound. No acute abnormality identified. No evidence for torsion. Electronically Signed   By: Rise Mu M.D.   On: 01/28/2017 00:12    ____________________________________________   PROCEDURES  Critical Care performed: No   Procedure(s) performed:   Procedures   ____________________________________________   INITIAL IMPRESSION / ASSESSMENT AND PLAN / ED COURSE  Pertinent labs & imaging results that were available during my care of the patient were reviewed by me and considered in my medical decision making (see chart for details).  The patient and the mother are vague historians, but I reviewed the prior notes in [facility] and about 11 months ago she had a similar presentation with a complex right-sided ovarian cyst that had some layering debris.  She did not require any surgical intervention but was treated with doxycycline because it had an infectious appearance.  Apparently she and the mother declined having a pelvic exam at the time (the mother reports that the patient has "a low IQ" and helps her with her decision making).  She is currently not having any infectious symptoms and has stable vital signs.  She has had no injuries or trauma although her symptoms may be suggestive of a muscular hematoma which would explain the location of the pain and its radiation down her left leg.  She has no low back pain and no tenderness to palpation along her spine and she has no CVA tenderness.  Given her history of ovarian issues, I will begin the evaluation with pelvic ultrasound to evaluate for cysts, torsion, etc.  However her physical exam is reassuring at this time.   If the ultrasound has no abnormal findings and will likely proceed to a CT scan.  PO meds for pain control at this time.  Mother and patient agree with the plan.  Clinical Course as of Jan 29 248  Fri Jan 28, 2017  0040 Completely normal ultrasound, no explanation for pain.  Will proceed with CT scan.  [CF]  0213 Workup has been completely normal including CT scan and ultrasound.  Provided reassurance to patient and mother.  The patient is sexually active but states that she always uses a condom.  I offered a pelvic exam and explained the reasons why but she declines at this time and prefers to follow-up with her OB/GYN which I think is acceptable.  Suspect musculoskeletal pain.  Providing Toradol and encouraged the use of ibuprofen and acetaminophen.    I gave my usual and customary return precautions.   Patient and mother understand and agree with the plan.  [CF]    Clinical Course User Index [CF] Loleta Rose, MD    ____________________________________________  FINAL CLINICAL IMPRESSION(S) / ED DIAGNOSES  Final diagnoses:  LLQ pain     MEDICATIONS GIVEN DURING THIS VISIT:  Medications  oxyCODONE-acetaminophen (PERCOCET/ROXICET) 5-325 MG per tablet 2 tablet (2 tablets  Oral Given 01/27/17 2314)  iopamidol (ISOVUE-300) 61 % injection 30 mL (30 mLs Oral Contrast Given 01/28/17 0055)  iopamidol (ISOVUE-300) 61 % injection 100 mL (100 mLs Intravenous Contrast Given 01/28/17 0145)  ketorolac (TORADOL) 30 MG/ML injection 15 mg (15 mg Intravenous Given 01/28/17 0230)     NEW OUTPATIENT MEDICATIONS STARTED DURING THIS VISIT:  Discharge Medication List as of 01/28/2017  2:18 AM      Discharge Medication List as of 01/28/2017  2:18 AM      Discharge Medication List as of 01/28/2017  2:18 AM       Note:  This document was prepared using Dragon voice recognition software and may include unintentional dictation errors.    Loleta Rose, MD 01/28/17 [phone]

## 2017-01-27 NOTE — ED Notes (Signed)
Patient transported to Ultrasound 

## 2017-01-28 ENCOUNTER — Encounter: Payer: Self-pay | Admitting: Radiology

## 2017-01-28 ENCOUNTER — Emergency Department: Payer: Medicaid Other

## 2017-01-28 LAB — LIPASE, BLOOD: LIPASE: 20 U/L (ref 11–51)

## 2017-01-28 LAB — HEPATIC FUNCTION PANEL
ALBUMIN: 4.2 g/dL (ref 3.5–5.0)
ALT: 14 U/L (ref 14–54)
AST: 22 U/L (ref 15–41)
Alkaline Phosphatase: 29 U/L — ABNORMAL LOW (ref 38–126)
Bilirubin, Direct: <0.1 mg/dL — ABNORMAL LOW (ref 0.1–0.5)
TOTAL PROTEIN: 7.7 g/dL (ref 6.5–8.1)
Total Bilirubin: 0.4 mg/dL (ref 0.3–1.2)

## 2017-01-28 MED ORDER — IOPAMIDOL (ISOVUE-300) INJECTION 61%
30.0000 mL | Freq: Once | INTRAVENOUS | Status: AC
  Administered 2017-01-28: 30 mL via ORAL

## 2017-01-28 MED ORDER — KETOROLAC TROMETHAMINE 30 MG/ML IJ SOLN
15.0000 mg | Freq: Once | INTRAMUSCULAR | Status: AC
Start: 2017-01-28 — End: 2017-01-28
  Administered 2017-01-28: 15 mg via INTRAVENOUS
  Filled 2017-01-28: qty 1

## 2017-01-28 MED ORDER — IOPAMIDOL (ISOVUE-300) INJECTION 61%
100.0000 mL | Freq: Once | INTRAVENOUS | Status: AC | PRN
  Administered 2017-01-28: 100 mL via INTRAVENOUS

## 2017-01-28 NOTE — Discharge Instructions (Signed)
You have been seen in the Emergency Department (ED) for abdominal pain.  Your evaluation did not identify a clear cause of your symptoms but was generally reassuring.  Your pelvic ultrasounds and your CT scan were both negative, and your blood work and urinalysis were normal too.  You declined to have us perform a pelvic exam, so please discuss this with your OB/GYN when you follow up.  Please follow up as instructed above regarding today?s emergent visit and the symptoms that are bothering you.  Return to the ED if your abdominal pain worsens or fails to improve, you develop bloody vomiting, bloody diarrhea, you are unable to tolerate fluids due to vomiting, fever greater than 101, or other symptoms that concern you.

## 2017-01-28 NOTE — ED Notes (Signed)
Patient transported to CT 

## 2018-03-11 ENCOUNTER — Other Ambulatory Visit: Payer: Self-pay

## 2018-03-11 ENCOUNTER — Emergency Department
Admission: EM | Admit: 2018-03-11 | Discharge: 2018-03-11 | Disposition: A | Discharge status: Home / Self Care | Payer: Medicaid Other | Attending: Emergency Medicine | Admitting: Emergency Medicine

## 2018-03-11 ENCOUNTER — Emergency Department: Payer: Medicaid Other

## 2018-03-11 DIAGNOSIS — Y92009 Unspecified place in unspecified non-institutional (private) residence as the place of occurrence of the external cause: Secondary | ICD-10-CM | POA: Diagnosis not present

## 2018-03-11 DIAGNOSIS — S0990XA Unspecified injury of head, initial encounter: Secondary | ICD-10-CM | POA: Diagnosis present

## 2018-03-11 DIAGNOSIS — S060X0A Concussion without loss of consciousness, initial encounter: Secondary | ICD-10-CM | POA: Diagnosis not present

## 2018-03-11 DIAGNOSIS — Y999 Unspecified external cause status: Secondary | ICD-10-CM | POA: Diagnosis not present

## 2018-03-11 DIAGNOSIS — Z79899 Other long term (current) drug therapy: Secondary | ICD-10-CM | POA: Insufficient documentation

## 2018-03-11 DIAGNOSIS — W228XXA Striking against or struck by other objects, initial encounter: Secondary | ICD-10-CM | POA: Diagnosis not present

## 2018-03-11 DIAGNOSIS — Y9389 Activity, other specified: Secondary | ICD-10-CM | POA: Insufficient documentation

## 2018-03-11 MED ORDER — DIPHENHYDRAMINE HCL 50 MG/ML IJ SOLN
50.0000 mg | Freq: Once | INTRAMUSCULAR | Status: AC
  Administered 2018-03-11: 50 mg via INTRAMUSCULAR
  Filled 2018-03-11: qty 1

## 2018-03-11 MED ORDER — BUTALBITAL-APAP-CAFFEINE 50-325-40 MG PO TABS: 1.0000 | ORAL_TABLET | Freq: Four times a day (QID) | ORAL | 0 refills | Status: DC | PRN

## 2018-03-11 MED ORDER — KETOROLAC TROMETHAMINE 30 MG/ML IJ SOLN
30.0000 mg | Freq: Once | INTRAMUSCULAR | Status: AC
  Administered 2018-03-11: 30 mg via INTRAMUSCULAR
  Filled 2018-03-11: qty 1

## 2018-03-11 MED ORDER — PROMETHAZINE HCL 25 MG/ML IJ SOLN
25.0000 mg | Freq: Once | INTRAMUSCULAR | Status: AC
  Administered 2018-03-11: 25 mg via INTRAMUSCULAR
  Filled 2018-03-11: qty 1

## 2018-03-11 NOTE — ED Notes (Signed)
Pt c/o H/A, n/v and dizziness while walking. Pt reports being diagnosed with a concussion 2 days ago. Pt also reports still seeing spots and unable to keep her balance x2 days.

## 2018-03-11 NOTE — ED Provider Notes (Signed)
[facility] Emergency Department Provider Note  ____________________________________________  Time seen: Approximately 9:21 PM  I have reviewed the triage vital signs and the nursing notes.   HISTORY  Chief Complaint Head Injury    HPI Regina [name] is a 27 y.o. female who presents the emergency department complaining of headache, nausea, emesis, photophobia x2 days.  Patient reports that she had left the door open to her dryer, hit her head in the occipital region.  Patient reports that she hit so hard and knocked her off of her feet.  Patient reports that she has had 2 episodes of emesis with ongoing nausea since head injury.  Patient reports that her headache is in the left frontal region.  She reports she has had some mild blurry vision as well as photophobia.  Patient reports that the pain is sharp, pounding, constant.  New medication for this complaint prior to arrival.  Patient denies any lacerations from head injury.  Patient denies any neck pain, chest pain, shortness of breath, abdominal pain, diarrhea or constipation.  Past Medical History:  Diagnosis Date  . ADHD (attention deficit hyperactivity disorder)   . Arthritis   . Scoliosis     There are no active problems to display for this patient.   Past Surgical History:  Procedure Laterality Date  . BREAST SURGERY Right    Cyst removal    Prior to Admission medications   Medication Sig Start Date End Date Taking? Authorizing Provider  Aspirin-Salicylamide-Caffeine (BC HEADACHE POWDER PO) Take 1 packet by mouth daily as needed. For pain    [provider]  butalbital-acetaminophen-caffeine (FIORICET, ESGIC) [phone] MG tablet Take 1 tablet by mouth every 6 (six) hours as needed for headache. 03/11/18 03/11/19  Alishea Beaudin, Delorise Royals, PA-C  CRYSELLE-28 0.3-30 MG-MCG tablet Take 1 tablet by mouth daily.    [provider]  diphenhydrAMINE (SOMINEX) 25 MG tablet Take 25 mg by mouth  at bedtime as needed for allergies or sleep.    [provider]  doxycycline (VIBRAMYCIN) 100 MG capsule Take 1 capsule (100 mg total) by mouth 2 (two) times daily. Patient not taking: Reported on 01/27/2017 02/16/16   Marlon Pel, PA-C  oxyCODONE-acetaminophen (PERCOCET/ROXICET) 5-325 MG tablet Take 1 tablet by mouth every 6 (six) hours as needed for severe pain. Patient not taking: Reported on 01/27/2017 02/16/16   Marlon Pel, PA-C    Allergies Patient has no known allergies.  No family history on file.  Social History Social History   Tobacco Use  . Smoking status: Never Smoker  Substance Use Topics  . Alcohol use: No  . Drug use: Yes    Types: Cocaine     Review of Systems  Constitutional: No fever/chills Eyes: Intermittent blurry vision.  Photophobia. ENT: No upper respiratory complaints. Cardiovascular: no chest pain. Respiratory: no cough. No SOB. Gastrointestinal: No abdominal pain.  No nausea, no vomiting.   Musculoskeletal: Negative for musculoskeletal pain. Skin: Negative for rash, abrasions, lacerations, ecchymosis. Neurological: Positive for headache but denies focal weakness or numbness. 10-point ROS otherwise negative.  ____________________________________________   PHYSICAL EXAM:  VITAL SIGNS: ED Triage Vitals  Enc Vitals Group     BP 03/11/18 2058 115/76     Pulse Rate 03/11/18 2058 85     Resp 03/11/18 2058 18     Temp 03/11/18 2058 98.3 F (36.8 C)     Temp Source 03/11/18 2058 Oral     SpO2 03/11/18 2058 100 %  Weight 03/11/18 2056 120 lb (54.4 kg)     Height 03/11/18 2056  (1.753 m)     Head Circumference --      Peak Flow --      Pain Score 03/11/18 2056 10     Pain Loc --      Pain Edu? --      Excl. in GC? --      Constitutional: Alert and oriented. Well appearing and in no acute distress. Eyes: Conjunctivae are normal. PERRL. EOMI. patient squints, withdrawals with funduscopic exam and light. Head: No  visible signs of trauma with lacerations, abrasions, hematoma, ecchymosis.  No battle signs, no raccoon eyes, no serosanguineous fluid drainage from the ears or nares.  Patient is tender to palpation midline occipital region of the skull.  No palpable abnormality or crepitus. ENT:      Ears:       Nose: No congestion/rhinnorhea.      Mouth/Throat: Mucous membranes are moist.  Neck: No stridor.  No cervical spine tenderness to palpation.  Cardiovascular: Normal rate, regular rhythm. Normal S1 and S2.  Good peripheral circulation. Respiratory: Normal respiratory effort without tachypnea or retractions. Lungs CTAB. Good air entry to the bases with no decreased or absent breath sounds. Musculoskeletal: Full range of motion to all extremities. No gross deformities appreciated. Neurologic:  Normal speech and language. No gross focal neurologic deficits are appreciated.  Cranial nerves II through XII grossly intact.  Negative Romberg's and pronator drift. Skin:  Skin is warm, dry and intact. No rash noted. Psychiatric: Mood and affect are normal. Speech and behavior are normal. Patient exhibits appropriate insight and judgement.   ____________________________________________   LABS (all labs ordered are listed, but only abnormal results are displayed)  Labs Reviewed - No data to display ____________________________________________  EKG   ____________________________________________  RADIOLOGY Festus Barren Aayliah Rotenberry, personally viewed and evaluated these images (plain radiographs) as part of my medical decision making, as well as reviewing the written report by the radiologist.  I concur with radiologist of no acute osseous or intracranial abnormality.  Ct Head Wo Contrast  Result Date: 03/11/2018 CLINICAL DATA:  27 year old female with posttraumatic headache. EXAM: CT HEAD WITHOUT CONTRAST TECHNIQUE: Contiguous axial images were obtained from the base of the skull through the vertex without  intravenous contrast. COMPARISON:  None. FINDINGS: Brain: No evidence of acute infarction, hemorrhage, hydrocephalus, extra-axial collection or mass lesion/mass effect. Vascular: No hyperdense vessel or unexpected calcification. Skull: Normal. Negative for fracture or focal lesion. Sinuses/Orbits: No acute finding. Other: None IMPRESSION: Normal noncontrast CT of the brain. Electronically Signed   By: Elgie Collard M.D.   On: 03/11/2018 21:56    ____________________________________________    PROCEDURES  Procedure(s) performed:    Procedures    Medications  ketorolac (TORADOL) 30 MG/ML injection 30 mg (has no administration in time range)  promethazine (PHENERGAN) injection 25 mg (has no administration in time range)  diphenhydrAMINE (BENADRYL) injection 50 mg (has no administration in time range)     ____________________________________________   INITIAL IMPRESSION / ASSESSMENT AND PLAN / ED COURSE  Pertinent labs & imaging results that were available during my care of the patient were reviewed by me and considered in my medical decision making (see chart for details).  Review of the [place] CSRS was performed in accordance of the [facility] prior to dispensing any controlled drugs.     Patient's diagnosis is consistent with concussion.  Patient presents with ongoing headache, nausea, emesis after  head injury.  Initial differential included concussion, skull fracture, intracranial hemorrhage.  Exam is overall reassuring.  CT scan reveals no intracranial osseous abnormality.  Symptoms are most consistent with concussion.  Patient is given medication for headache and nausea at this time.. Patient will be discharged home with prescriptions for Fioricet for any return of headache. Patient is to follow up with me care or neurology as needed or otherwise directed. Patient is given ED precautions to return to the ED for any worsening or new  symptoms.     ____________________________________________  FINAL CLINICAL IMPRESSION(S) / ED DIAGNOSES  Final diagnoses:  Concussion without loss of consciousness, initial encounter      NEW MEDICATIONS STARTED DURING THIS VISIT:  ED Discharge Orders        Ordered    butalbital-acetaminophen-caffeine (FIORICET, ESGIC) 50-325-40 MG tablet  Every 6 hours PRN     03/11/18 2225          This chart was dictated using voice recognition software/Dragon. Despite best efforts to proofread, errors can occur which can change the meaning. Any change was purely unintentional.    Racheal Patches, PA-C 03/11/18 2226    Emily Filbert, MD 03/11/18 [phone]

## 2018-03-11 NOTE — ED Triage Notes (Signed)
Patient reports forgot dryer door was open and slammed her head on it.  Reports headache ever since.  Reports happened 2 days ago.

## 2018-03-20 ENCOUNTER — Emergency Department: Payer: Medicaid Other

## 2018-03-20 ENCOUNTER — Encounter: Payer: Self-pay | Admitting: Emergency Medicine

## 2018-03-20 ENCOUNTER — Emergency Department
Admission: EM | Admit: 2018-03-20 | Discharge: 2018-03-20 | Disposition: A | Discharge status: Home / Self Care | Payer: Medicaid Other | Attending: Emergency Medicine | Admitting: Emergency Medicine

## 2018-03-20 ENCOUNTER — Other Ambulatory Visit: Payer: Self-pay

## 2018-03-20 DIAGNOSIS — R42 Dizziness and giddiness: Secondary | ICD-10-CM | POA: Diagnosis not present

## 2018-03-20 DIAGNOSIS — R51 Headache: Secondary | ICD-10-CM | POA: Insufficient documentation

## 2018-03-20 DIAGNOSIS — F0781 Postconcussional syndrome: Secondary | ICD-10-CM | POA: Insufficient documentation

## 2018-03-20 MED ORDER — KETOROLAC TROMETHAMINE 30 MG/ML IJ SOLN
30.0000 mg | Freq: Once | INTRAMUSCULAR | Status: AC
Start: 2018-03-20 — End: 2018-03-20
  Administered 2018-03-20: 30 mg via INTRAMUSCULAR
  Filled 2018-03-20: qty 1

## 2018-03-20 MED ORDER — DIPHENHYDRAMINE HCL 50 MG/ML IJ SOLN
25.0000 mg | Freq: Once | INTRAMUSCULAR | Status: AC
  Administered 2018-03-20: 25 mg via INTRAMUSCULAR

## 2018-03-20 MED ORDER — DIPHENHYDRAMINE HCL 50 MG/ML IJ SOLN: 25.0000 mg | Freq: Once | INTRAMUSCULAR | Status: DC

## 2018-03-20 MED ORDER — DIPHENHYDRAMINE HCL 50 MG/ML IJ SOLN
INTRAMUSCULAR | Status: AC
  Filled 2018-03-20: qty 1

## 2018-03-20 MED ORDER — PROCHLORPERAZINE EDISYLATE 10 MG/2ML IJ SOLN
10.0000 mg | Freq: Once | INTRAMUSCULAR | Status: AC
  Administered 2018-03-20: 10 mg via INTRAMUSCULAR
  Filled 2018-03-20: qty 2

## 2018-03-20 MED ORDER — BUTALBITAL-APAP-CAFFEINE 50-325-40 MG PO TABS: 1.0000 | ORAL_TABLET | Freq: Four times a day (QID) | ORAL | 0 refills | Status: AC | PRN

## 2018-03-20 NOTE — ED Notes (Signed)
Per Ron and Bjorn Loser [facility]) this pt should not be a 4 and needs to be seen in cpod or main.

## 2018-03-20 NOTE — ED Notes (Signed)
Per Dr Pershing Proud, recheck CT head and can be seen in flex if all clear.

## 2018-03-20 NOTE — ED Triage Notes (Signed)
Pt states she was seen here early this month for concussion, states pain has continued, has been nauseated without vomiting. Appears in NAD. Tried to get in to be seen by Dr Hale Bogus, said he wasn't in this whole week.

## 2018-03-20 NOTE — ED Provider Notes (Signed)
[facility] Emergency Department Provider Note  ___________________________________________   First MD Initiated Contact with Patient 03/20/18 1438     (approximate)  I have reviewed the triage vital signs and the nursing notes.   HISTORY  Chief Complaint Headache   HPI Regina [name] is a 27 y.o. female with a history of ADHD as well as a concussion several weeks ago after hitting her head on a drying machine door who was presented to the emergency department with 9 out of 10, sharp pain that starts in the front of her head and radiates through to the back.  It is also associated with dizziness when getting up from sitting position as well as photophobia.  Says that she has been using Fioricet which has provided relief and only has 1 tab left.  Says the pain has been slowly worsening.  However, denies any weakness or numbness.  Is presenting for reassessment.   Past Medical History:  Diagnosis Date  . ADHD (attention deficit hyperactivity disorder)   . Arthritis   . Scoliosis     There are no active problems to display for this patient.   Past Surgical History:  Procedure Laterality Date  . BREAST SURGERY Right    Cyst removal    Prior to Admission medications   Medication Sig Start Date End Date Taking? Authorizing Provider  Aspirin-Salicylamide-Caffeine (BC HEADACHE POWDER PO) Take 1 packet by mouth daily as needed. For pain    [provider]  butalbital-acetaminophen-caffeine (FIORICET, ESGIC) [phone] MG tablet Take 1 tablet by mouth every 6 (six) hours as needed for headache. 03/11/18 03/11/19  Cuthriell, Delorise Royals, PA-C  CRYSELLE-28 0.3-30 MG-MCG tablet Take 1 tablet by mouth daily.    [provider]  diphenhydrAMINE (SOMINEX) 25 MG tablet Take 25 mg by mouth at bedtime as needed for allergies or sleep.    [provider]  doxycycline (VIBRAMYCIN) 100 MG capsule Take 1 capsule (100 mg total) by mouth 2 (two) times  daily. Patient not taking: Reported on 01/27/2017 02/16/16   Marlon Pel, PA-C  oxyCODONE-acetaminophen (PERCOCET/ROXICET) 5-325 MG tablet Take 1 tablet by mouth every 6 (six) hours as needed for severe pain. Patient not taking: Reported on 01/27/2017 02/16/16   Marlon Pel, PA-C    Allergies Patient has no known allergies.  No family history on file.  Social History Social History   Tobacco Use  . Smoking status: Never Smoker  . Smokeless tobacco: Never Used  Substance Use Topics  . Alcohol use: No  . Drug use: Yes    Types: Cocaine    Review of Systems  Constitutional: No fever/chills Eyes: No visual changes. ENT: No sore throat. Cardiovascular: Denies chest pain. Respiratory: Denies shortness of breath. Gastrointestinal: No abdominal pain.  No nausea, no vomiting.  No diarrhea.  No constipation. Genitourinary: Negative for dysuria. Musculoskeletal: Negative for back pain. Skin: Negative for rash. Neurological: Negative for focal weakness or numbness.   ____________________________________________   PHYSICAL EXAM:  VITAL SIGNS: ED Triage Vitals  Enc Vitals Group     BP 03/20/18 1357 129/80     Pulse Rate 03/20/18 1357 89     Resp 03/20/18 1357 18     Temp 03/20/18 1357 98 F (36.7 C)     Temp Source 03/20/18 1357 Oral     SpO2 03/20/18 1357 100 %     Weight 03/20/18 1358 110 lb (49.9 kg)     Height 03/20/18 1358  (1.702 m)  Head Circumference --      Peak Flow --      Pain Score 03/20/18 1358 10     Pain Loc --      Pain Edu? --      Excl. in GC? --     Constitutional: Alert and oriented. Well appearing and in no acute distress. Eyes: Conjunctivae are normal.  Pupils are PE RRL. Head: Atraumatic. Nose: No congestion/rhinnorhea. Mouth/Throat: Mucous membranes are moist.  Neck: No stridor.   Cardiovascular: Normal rate, regular rhythm. Grossly normal heart sounds.   Respiratory: Normal respiratory effort.  No retractions. Lungs  CTAB. Gastrointestinal: Soft and nontender. No distention.  Musculoskeletal: No lower extremity tenderness nor edema.  No joint effusions. Neurologic:  Normal speech and language. No gross focal neurologic deficits are appreciated. Skin:  Skin is warm, dry and intact. No rash noted. Psychiatric: Mood and affect are normal. Speech and behavior are normal.  ____________________________________________   LABS (all labs ordered are listed, but only abnormal results are displayed)  Labs Reviewed - No data to display ____________________________________________  EKG   ____________________________________________  RADIOLOGY  No acute finding on the CT of the brain. ____________________________________________   PROCEDURES  Procedure(s) performed:   Procedures  Critical Care performed:   ____________________________________________   INITIAL IMPRESSION / ASSESSMENT AND PLAN / ED COURSE  Pertinent labs & imaging results that were available during my care of the patient were reviewed by me and considered in my medical decision making (see chart for details).  Differential diagnosis includes, but is not limited to, intracranial hemorrhage, meningitis/encephalitis, previous head trauma, cavernous venous thrombosis, tension headache, temporal arteritis, migraine or migraine equivalent, idiopathic intracranial hypertension, and non-specific headache. As part of my medical decision making, I reviewed the following data within the electronic MEDICAL RECORD NUMBER Notes from prior ED visits  Patient requesting repeat IM pain medications that she received last time.  I will also be refilling her Fioricet.  We discussed brain rest precautions and follow-up with neurology.  Likely ongoing postconcussive syndrome.  Patient understands diagnosis as well as treatment plan willing to comply. ____________________________________________   FINAL CLINICAL IMPRESSION(S) / ED  DIAGNOSES  Postconcussive syndrome.    NEW MEDICATIONS STARTED DURING THIS VISIT:  New Prescriptions   No medications on file     Note:  This document was prepared using Dragon voice recognition software and may include unintentional dictation errors.     Myrna Blazer, MD 03/20/18 [phone]

## 2018-04-12 DIAGNOSIS — R519 Headache, unspecified: Secondary | ICD-10-CM | POA: Insufficient documentation

## 2018-04-12 DIAGNOSIS — F0781 Postconcussional syndrome: Secondary | ICD-10-CM | POA: Insufficient documentation

## 2018-08-20 ENCOUNTER — Other Ambulatory Visit: Payer: Self-pay

## 2018-08-20 ENCOUNTER — Emergency Department: Payer: Medicaid Other

## 2018-08-20 ENCOUNTER — Emergency Department
Admission: EM | Admit: 2018-08-20 | Discharge: 2018-08-20 | Disposition: A | Discharge status: Home / Self Care | Payer: Medicaid Other | Attending: Emergency Medicine | Admitting: Emergency Medicine

## 2018-08-20 DIAGNOSIS — F1410 Cocaine abuse, uncomplicated: Secondary | ICD-10-CM | POA: Insufficient documentation

## 2018-08-20 DIAGNOSIS — K529 Noninfective gastroenteritis and colitis, unspecified: Secondary | ICD-10-CM | POA: Diagnosis not present

## 2018-08-20 DIAGNOSIS — K5900 Constipation, unspecified: Secondary | ICD-10-CM | POA: Insufficient documentation

## 2018-08-20 DIAGNOSIS — Z79899 Other long term (current) drug therapy: Secondary | ICD-10-CM | POA: Insufficient documentation

## 2018-08-20 DIAGNOSIS — R109 Unspecified abdominal pain: Secondary | ICD-10-CM | POA: Diagnosis present

## 2018-08-20 LAB — COMPREHENSIVE METABOLIC PANEL
ALBUMIN: 4.5 g/dL (ref 3.5–5.0)
ALT: 19 U/L (ref 0–44)
AST: 17 U/L (ref 15–41)
Alkaline Phosphatase: 73 U/L (ref 38–126)
Anion gap: 7 (ref 5–15)
BUN: 14 mg/dL (ref 6–20)
CHLORIDE: 105 mmol/L (ref 98–111)
CO2: 27 mmol/L (ref 22–32)
Calcium: 9.2 mg/dL (ref 8.9–10.3)
Creatinine, Ser: 0.62 mg/dL (ref 0.44–1.00)
GFR calc Af Amer: >60 mL/min (ref >60)
GFR calc non Af Amer: >60 mL/min (ref >60)
GLUCOSE: 99 mg/dL (ref 70–99)
Potassium: 3.8 mmol/L (ref 3.5–5.1)
Sodium: 139 mmol/L (ref 135–145)
Total Bilirubin: 0.3 mg/dL (ref 0.3–1.2)
Total Protein: 8.3 g/dL — ABNORMAL HIGH (ref 6.5–8.1)

## 2018-08-20 LAB — CBC
HEMATOCRIT: 38.5 % (ref 36.0–46.0)
HEMOGLOBIN: 12.3 g/dL (ref 12.0–15.0)
MCH: 28.1 pg (ref 26.0–34.0)
MCHC: 31.9 g/dL (ref 30.0–36.0)
MCV: 87.9 fL (ref 80.0–100.0)
Platelets: 361 10*3/uL (ref 150–400)
RBC: 4.38 MIL/uL (ref 3.87–5.11)
RDW: 13.7 % (ref 11.5–15.5)
WBC: 8.7 10*3/uL (ref 4.0–10.5)
nRBC: 0 % (ref 0.0–0.2)

## 2018-08-20 LAB — URINALYSIS, COMPLETE (UACMP) WITH MICROSCOPIC
BACTERIA UA: NONE SEEN
Bilirubin Urine: NEGATIVE
Glucose, UA: NEGATIVE mg/dL
Ketones, ur: NEGATIVE mg/dL
Leukocytes, UA: NEGATIVE
NITRITE: NEGATIVE
PROTEIN: NEGATIVE mg/dL
Specific Gravity, Urine: 1.015 (ref 1.005–1.030)
pH: 7 (ref 5.0–8.0)

## 2018-08-20 LAB — LIPASE, BLOOD: LIPASE: 28 U/L (ref 11–51)

## 2018-08-20 LAB — POCT PREGNANCY, URINE: PREG TEST UR: NEGATIVE

## 2018-08-20 MED ORDER — DICYCLOMINE HCL 10 MG PO CAPS
20.0000 mg | ORAL_CAPSULE | Freq: Once | ORAL | Status: AC
  Administered 2018-08-20: 20 mg via ORAL
  Filled 2018-08-20: qty 2

## 2018-08-20 MED ORDER — HALOPERIDOL LACTATE 5 MG/ML IJ SOLN
5.0000 mg | Freq: Once | INTRAMUSCULAR | Status: AC
  Administered 2018-08-20: 5 mg via INTRAVENOUS
  Filled 2018-08-20: qty 1

## 2018-08-20 MED ORDER — IOPAMIDOL (ISOVUE-300) INJECTION 61%
75.0000 mL | Freq: Once | INTRAVENOUS | Status: AC | PRN
  Administered 2018-08-20: 75 mL via INTRAVENOUS

## 2018-08-20 MED ORDER — CIPROFLOXACIN HCL 500 MG PO TABS
500.0000 mg | ORAL_TABLET | Freq: Once | ORAL | Status: AC
  Administered 2018-08-20: 500 mg via ORAL
  Filled 2018-08-20: qty 1

## 2018-08-20 MED ORDER — KETOROLAC TROMETHAMINE 60 MG/2ML IM SOLN
60.0000 mg | Freq: Once | INTRAMUSCULAR | Status: AC
  Administered 2018-08-20: 60 mg via INTRAMUSCULAR
  Filled 2018-08-20: qty 2

## 2018-08-20 MED ORDER — METRONIDAZOLE 500 MG PO TABS: 500.0000 mg | ORAL_TABLET | Freq: Two times a day (BID) | ORAL | 0 refills | Status: DC

## 2018-08-20 MED ORDER — CIPROFLOXACIN HCL 500 MG PO TABS: 500.0000 mg | ORAL_TABLET | Freq: Two times a day (BID) | ORAL | 0 refills | Status: DC

## 2018-08-20 MED ORDER — METRONIDAZOLE 500 MG PO TABS
500.0000 mg | ORAL_TABLET | Freq: Once | ORAL | Status: AC
  Administered 2018-08-20: 500 mg via ORAL
  Filled 2018-08-20: qty 1

## 2018-08-20 NOTE — ED Notes (Addendum)
Pt could not tolerate the entire enema. Pt defecates just water. Pt also reports wiping some blood from her bottom after defecating and pain sharpened. EDP notified.

## 2018-08-20 NOTE — ED Provider Notes (Signed)
CT scan demonstrates likely descending colitis, otherwise reassuring.  Patient reports she is feeling better and is ready to go.  Will start on Cipro Flagyl, follow-up with PCP, return precautions discussed   Jene Every, MD 08/20/18 2155

## 2018-08-20 NOTE — ED Notes (Signed)
First nurse called to update RN on new patient.

## 2018-08-20 NOTE — ED Triage Notes (Addendum)
Pt c/o stabbing mid abd pain with nausea that started today. Denies V/D. States last BM was 3 days ago/.

## 2018-08-20 NOTE — ED Notes (Signed)
Pt has tried using "laxative pills" this morning to relieve her constipation.

## 2018-08-20 NOTE — ED Provider Notes (Signed)
[facility] Emergency Department Provider Note   ____________________________________________   I have reviewed the triage vital signs and the nursing notes.   HISTORY  Chief Complaint Abdominal Pain and Constipation   History limited by: Not Limited   HPI Regina [name] is a 27 y.o. female who presents to the emergency department today because of concerns for abdominal pain.  Patient states she has not had a bowel movement in the past 3 days.  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  She did start having abdominal pain until yesterday.  It initially was mild but although today was more severe.  Located across her lower abdomen.  Patient states that she has had associated watery diarrhea.  [facility] went to [facility]  She states that a pelvic exam was performed at [facility].  He states that this came back normal and that point is when they recommended she come to the emergency department.   Per medical record review patient has a history of ADHD.  Past Medical History:  Diagnosis Date  . ADHD (attention deficit hyperactivity disorder)   . Arthritis   . Scoliosis     There are no active problems to display for this patient.   Past Surgical History:  Procedure Laterality Date  . BREAST SURGERY Right    Cyst removal    Prior to Admission medications   Medication Sig Start Date End Date Taking? Authorizing Provider  Aspirin-Salicylamide-Caffeine (BC HEADACHE POWDER PO) Take 1 packet by mouth daily as needed. For pain    [provider]  butalbital-acetaminophen-caffeine (FIORICET, ESGIC) [phone] MG tablet Take 1-2 tablets by mouth every 6 (six) hours as needed for headache. 03/20/18 03/20/19  Myrna Blazer, MD  CRYSELLE-28 0.3-30 MG-MCG tablet Take 1 tablet by mouth daily.    [provider]  diphenhydrAMINE (SOMINEX) 25 MG tablet Take 25 mg by mouth at bedtime as needed for  allergies or sleep.    [provider]  doxycycline (VIBRAMYCIN) 100 MG capsule Take 1 capsule (100 mg total) by mouth 2 (two) times daily. Patient not taking: Reported on 01/27/2017 02/16/16   Marlon Pel, PA-C  oxyCODONE-acetaminophen (PERCOCET/ROXICET) 5-325 MG tablet Take 1 tablet by mouth every 6 (six) hours as needed for severe pain. Patient not taking: Reported on 01/27/2017 02/16/16   Marlon Pel, PA-C    Allergies Patient has no known allergies.  No family history on file.  Social History Social History   Tobacco Use  . Smoking status: Never Smoker  . Smokeless tobacco: Never Used  Substance Use Topics  . Alcohol use: No  . Drug use: Yes    Types: Cocaine    Review of Systems Constitutional: No fever/chills Eyes: No visual changes. ENT: No sore throat. Cardiovascular: Denies chest pain. Respiratory: Denies shortness of breath. Gastrointestinal: Lower abdominal pain, constipation. Genitourinary: Negative for dysuria. Musculoskeletal: Negative for back pain. Skin: Negative for rash. Neurological: Negative for headaches, focal weakness or numbness.  ____________________________________________   PHYSICAL EXAM:  VITAL SIGNS: ED Triage Vitals  Enc Vitals Group     BP 08/20/18 1544 119/90     Pulse Rate 08/20/18 1544 (!) 101     Resp 08/20/18 1544 15     Temp 08/20/18 1544 98.4 F (36.9 C)     Temp Source 08/20/18 1544 Oral     SpO2 08/20/18 1544 99 %     Weight 08/20/18 1546 118 lb (53.5 kg)  Height 08/20/18 1546 5\' 9"  (1.753 m)     Head Circumference --      Peak Flow --      Pain Score 08/20/18 1550 10   Constitutional: Alert and oriented.  Eyes: Conjunctivae are normal.  ENT      Head: Normocephalic and atraumatic.      Nose: No congestion/rhinnorhea.      Mouth/Throat: Mucous membranes are moist.      Neck: No stridor. Hematological/Lymphatic/Immunilogical: No cervical lymphadenopathy. Cardiovascular: Normal rate, regular  rhythm.  No murmurs, rubs, or gallops.  Respiratory: Normal respiratory effort without tachypnea nor retractions. Breath sounds are clear and equal bilaterally. No wheezes/rales/rhonchi. Gastrointestinal: Soft and tender in the lower abdomen.  Genitourinary: Deferred Musculoskeletal: Normal range of motion in all extremities. No lower extremity edema. Neurologic:  Normal speech and language. No gross focal neurologic deficits are appreciated.  Skin:  Skin is warm, dry and intact. No rash noted. Psychiatric: Mood and affect are normal. Speech and behavior are normal. Patient exhibits appropriate insight and judgment.  ____________________________________________    LABS (pertinent positives/negatives)  Lipase 28 CBC wbc 8.7, hgb 12.3, plt 361 Upreg negative CMP wnl except t pro 8.3 UA cloudy, small hgb dipstick, 0-5 rbc and wbc  ____________________________________________   EKG  None  ____________________________________________    RADIOLOGY  CT abd/pel pending  ____________________________________________   PROCEDURES  Procedures  ____________________________________________   INITIAL IMPRESSION / ASSESSMENT AND PLAN / ED COURSE  Pertinent labs & imaging results that were available during my care of the patient were reviewed by me and considered in my medical decision making (see chart for details).   Patient presented to the emergency department today because of concerns for lower abdominal pain and constipation.  Patient had an enema performed here without any significant output.  Patient was given medication here in the emergency department without any significant relief of the pain.  Given continued pain will obtain a CT scan.  ____________________________________________   FINAL CLINICAL IMPRESSION(S) / ED DIAGNOSES  Abdominal pain  Note: This dictation was prepared with Dragon dictation. Any transcriptional errors that result from this process are  unintentional     Phineas Semen, MD 08/20/18 2028

## 2018-08-31 ENCOUNTER — Emergency Department: Payer: Medicaid Other

## 2018-08-31 ENCOUNTER — Emergency Department
Admission: EM | Admit: 2018-08-31 | Discharge: 2018-08-31 | Disposition: A | Discharge status: Home / Self Care | Payer: Medicaid Other | Attending: Emergency Medicine | Admitting: Emergency Medicine

## 2018-08-31 ENCOUNTER — Encounter: Payer: Self-pay | Admitting: Emergency Medicine

## 2018-08-31 DIAGNOSIS — K5900 Constipation, unspecified: Secondary | ICD-10-CM | POA: Insufficient documentation

## 2018-08-31 DIAGNOSIS — Z79899 Other long term (current) drug therapy: Secondary | ICD-10-CM | POA: Diagnosis not present

## 2018-08-31 DIAGNOSIS — R109 Unspecified abdominal pain: Secondary | ICD-10-CM | POA: Diagnosis present

## 2018-08-31 DIAGNOSIS — R1084 Generalized abdominal pain: Secondary | ICD-10-CM

## 2018-08-31 LAB — COMPREHENSIVE METABOLIC PANEL
ALBUMIN: 4.5 g/dL (ref 3.5–5.0)
ALT: 18 U/L (ref 0–44)
ANION GAP: 8 (ref 5–15)
AST: 21 U/L (ref 15–41)
Alkaline Phosphatase: 64 U/L (ref 38–126)
BUN: 12 mg/dL (ref 6–20)
CO2: 29 mmol/L (ref 22–32)
Calcium: 9.2 mg/dL (ref 8.9–10.3)
Chloride: 104 mmol/L (ref 98–111)
Creatinine, Ser: 0.65 mg/dL (ref 0.44–1.00)
GFR calc Af Amer: >60 mL/min (ref >60)
GFR calc non Af Amer: >60 mL/min (ref >60)
GLUCOSE: 99 mg/dL (ref 70–99)
Potassium: 3.6 mmol/L (ref 3.5–5.1)
SODIUM: 141 mmol/L (ref 135–145)
Total Bilirubin: 0.5 mg/dL (ref 0.3–1.2)
Total Protein: 8.3 g/dL — ABNORMAL HIGH (ref 6.5–8.1)

## 2018-08-31 LAB — CBC
HEMATOCRIT: 40.4 % (ref 36.0–46.0)
Hemoglobin: 12.6 g/dL (ref 12.0–15.0)
MCH: 27.9 pg (ref 26.0–34.0)
MCHC: 31.2 g/dL (ref 30.0–36.0)
MCV: 89.4 fL (ref 80.0–100.0)
PLATELETS: 333 10*3/uL (ref 150–400)
RBC: 4.52 MIL/uL (ref 3.87–5.11)
RDW: 13.6 % (ref 11.5–15.5)
WBC: 6.3 10*3/uL (ref 4.0–10.5)
nRBC: 0 % (ref 0.0–0.2)

## 2018-08-31 LAB — URINALYSIS, COMPLETE (UACMP) WITH MICROSCOPIC
Bilirubin Urine: NEGATIVE
Glucose, UA: NEGATIVE mg/dL
Hgb urine dipstick: NEGATIVE
Ketones, ur: NEGATIVE mg/dL
Leukocytes, UA: NEGATIVE
Nitrite: NEGATIVE
Protein, ur: NEGATIVE mg/dL
Specific Gravity, Urine: 1.006 (ref 1.005–1.030)
pH: 8 (ref 5.0–8.0)

## 2018-08-31 LAB — LIPASE, BLOOD: Lipase: 34 U/L (ref 11–51)

## 2018-08-31 MED ORDER — CIPROFLOXACIN HCL 500 MG PO TABS: 500.0000 mg | ORAL_TABLET | Freq: Two times a day (BID) | ORAL | 0 refills | Status: DC

## 2018-08-31 MED ORDER — METRONIDAZOLE 500 MG PO TABS: 500.0000 mg | ORAL_TABLET | Freq: Two times a day (BID) | ORAL | 0 refills | Status: DC

## 2018-08-31 MED ORDER — FLEET ENEMA 7-19 GM/118ML RE ENEM
1.0000 | ENEMA | Freq: Once | RECTAL | Status: AC
  Administered 2018-08-31: 1 via RECTAL

## 2018-08-31 MED ORDER — SODIUM CHLORIDE 0.9 % IV BOLUS
1000.0000 mL | Freq: Once | INTRAVENOUS | Status: AC
  Administered 2018-08-31: 1000 mL via INTRAVENOUS

## 2018-08-31 MED ORDER — FENTANYL CITRATE (PF) 100 MCG/2ML IJ SOLN
50.0000 ug | Freq: Once | INTRAMUSCULAR | Status: AC
  Administered 2018-08-31: 50 ug via INTRAVENOUS
  Filled 2018-08-31: qty 2

## 2018-08-31 MED ORDER — DOCUSATE SODIUM 100 MG PO CAPS: 100.0000 mg | ORAL_CAPSULE | Freq: Every day | ORAL | 0 refills | Status: DC

## 2018-08-31 MED ORDER — DICYCLOMINE HCL 10 MG PO CAPS
20.0000 mg | ORAL_CAPSULE | Freq: Once | ORAL | Status: AC
  Administered 2018-08-31: 20 mg via ORAL
  Filled 2018-08-31: qty 2

## 2018-08-31 NOTE — ED Triage Notes (Signed)
Pt reports was seen here 2 Sundays for the same and dx'd with colitis. Was given abd, felt better but as soon as she finished the abx the pain came back and is now stronger. Pt reports has not had a BM since completing the abx 2-3 days ago. Pt reports feels nauseated.

## 2018-08-31 NOTE — Discharge Instructions (Signed)
Please call today and schedule an appointment with the GI doctor.  Return to the ER for symptoms that change or worsen if unable to schedule an appointment.

## 2018-08-31 NOTE — ED Notes (Signed)
See triage note  Presents with abd pain  States she was seen for same about 2- 2/1 weeks ago  Was dx'd with colitis  Was placed on antibiotics  Felt better but sx's returned couple of days ago   Denies any fever

## 2018-08-31 NOTE — ED Provider Notes (Signed)
[facility] Emergency Department Provider Note  ____________________________________________   None    (approximate)  I have reviewed the triage vital signs and the nursing notes.   HISTORY  Chief Complaint Abdominal Pain   HPI Regina [name] is a 27 y.o. female who presents to the emergency department for treatment and evaluation of abdominal pain. She was evaluated for the same about 10 days ago and diagnosed with colitis. She has taken all of the antibiotic and thought she was completely better, but 2 days ago the pain started again. She has also had constipation due to her "crooked bowl."    Past Medical History:  Diagnosis Date  . ADHD (attention deficit hyperactivity disorder)   . Arthritis   . Scoliosis     There are no active problems to display for this patient.   Past Surgical History:  Procedure Laterality Date  . BREAST SURGERY Right    Cyst removal    Prior to Admission medications   Medication Sig Start Date End Date Taking? Authorizing Provider  Aspirin-Salicylamide-Caffeine (BC HEADACHE POWDER PO) Take 1 packet by mouth daily as needed. For pain    [provider]  butalbital-acetaminophen-caffeine (FIORICET, ESGIC) [phone] MG tablet Take 1-2 tablets by mouth every 6 (six) hours as needed for headache. 03/20/18 03/20/19  Schaevitz, Myra Rude, MD  ciprofloxacin (CIPRO) 500 MG tablet Take 1 tablet (500 mg total) by mouth 2 (two) times daily. 08/20/18   Jene Every, MD  CRYSELLE-28 0.3-30 MG-MCG tablet Take 1 tablet by mouth daily.    [provider]  diphenhydrAMINE (SOMINEX) 25 MG tablet Take 25 mg by mouth at bedtime as needed for allergies or sleep.    [provider]  doxycycline (VIBRAMYCIN) 100 MG capsule Take 1 capsule (100 mg total) by mouth 2 (two) times daily. Patient not taking: Reported on 01/27/2017 02/16/16   Marlon Pel, PA-C  metroNIDAZOLE (FLAGYL) 500 MG tablet Take 1 tablet (500  mg total) by mouth 2 (two) times daily after a meal. 08/20/18   Jene Every, MD  oxyCODONE-acetaminophen (PERCOCET/ROXICET) 5-325 MG tablet Take 1 tablet by mouth every 6 (six) hours as needed for severe pain. Patient not taking: Reported on 01/27/2017 02/16/16   Marlon Pel, PA-C    Allergies Patient has no known allergies.  No family history on file.  Social History Social History   Tobacco Use  . Smoking status: Never Smoker  . Smokeless tobacco: Never Used  Substance Use Topics  . Alcohol use: No  . Drug use: Yes    Types: Cocaine    Review of Systems  Constitutional: No fever/chills Eyes: No visual changes. ENT: No sore throat. Cardiovascular: Denies chest pain. Respiratory: Denies shortness of breath. Gastrointestinal: Positive for abdominal pain.  No nausea, no vomiting.  No diarrhea.  Positive for constipation. Genitourinary: Negative for dysuria. Musculoskeletal: Negative for back pain. Skin: Negative for rash. Neurological: Negative for headaches, focal weakness or numbness. ____________________________________________   PHYSICAL EXAM:  VITAL SIGNS: ED Triage Vitals  Enc Vitals Group     BP 08/31/18 1026 130/85     Pulse Rate 08/31/18 1026 (!) 110     Resp 08/31/18 1026 18     Temp 08/31/18 1026 97.6 F (36.4 C)     Temp Source 08/31/18 1026 Oral     SpO2 08/31/18 1026 100 %     Weight 08/31/18 1026 135 lb (61.2 kg)     Height 08/31/18 1026 5\' 9"  (1.753 m)  Head Circumference --      Peak Flow --      Pain Score 08/31/18 1052 10     Pain Loc --      Pain Edu? --      Excl. in GC? --     Constitutional: Alert and oriented. Well appearing and in no acute distress. Eyes: Conjunctivae are normal. PERRL. EOMI. Head: Atraumatic. Nose: No congestion/rhinnorhea. Mouth/Throat: Mucous membranes are moist.  Oropharynx non-erythematous. Neck: No stridor.   Cardiovascular: Normal rate, regular rhythm. Grossly normal heart sounds.  Good peripheral  circulation. Respiratory: Normal respiratory effort.  No retractions. Lungs CTAB. Gastrointestinal: Soft and diffusely tender. No distention. No abdominal bruits. No CVA tenderness. Bowel sounds active and present x 4. Musculoskeletal: No lower extremity tenderness nor edema.  No joint effusions. Neurologic:  Normal speech and language. No gross focal neurologic deficits are appreciated. No gait instability. Skin:  Skin is warm, dry and intact. No rash noted. Psychiatric: Mood and affect are normal. Speech and behavior are normal.  ____________________________________________   LABS (all labs ordered are listed, but only abnormal results are displayed)  Labs Reviewed  COMPREHENSIVE METABOLIC PANEL - Abnormal; Notable for the following components:      Result Value   Total Protein 8.3 (*)    All other components within normal limits  URINALYSIS, COMPLETE (UACMP) WITH MICROSCOPIC - Abnormal; Notable for the following components:   Color, Urine STRAW (*)    APPearance CLEAR (*)    Bacteria, UA RARE (*)    All other components within normal limits  LIPASE, BLOOD  CBC   ____________________________________________  EKG  Not indicated. ____________________________________________  RADIOLOGY  ED MD interpretation:  Data reviewed from CT 10 days ago. Today, no evidence of SBO.  Official radiology report(s): Dg Abdomen 1 View  Result Date: 08/31/2018 CLINICAL DATA:  , Recent diagnosis of colitis EXAM: ABDOMEN - 1 VIEW COMPARISON:  CT abdomen pelvis of 08/20/2018 FINDINGS: Supine views of the abdomen show a large amount of feces throughout the entire colon. No bowel obstruction is seen. A rounded metallic foreign body is noted overlying the pelvis which may be artifactual, and there is a linear opacity overlying the left upper quadrant which may also be artifactual. No opaque calculi are seen. The bones are unremarkable. IMPRESSION: Large amount of feces throughout the entire colon. No  bowel obstruction. Electronically Signed   By: Dwyane Dee M.D.   On: 08/31/2018 13:42    ____________________________________________   PROCEDURES  Procedure(s) performed: None  Procedures  Critical Care performed: No  ____________________________________________   INITIAL IMPRESSION / ASSESSMENT AND PLAN / ED COURSE  As part of my medical decision making, I reviewed the following data within the electronic MEDICAL RECORD NUMBER Notes from prior ED visits   27 year old female presenting to the emergency department for treatment and evaluation of abdominal pain with constipation.  No indication to repeat the CT scan today.  Patient demonstrates no acute distress or acute findings on her exam and her x-ray is not concerning for small bowel obstruction.  While here today, she was given a fleets enema with moderate results.  She will be placed on Colace.  Because her symptoms are similar to when she was diagnosed with colitis, she will again be treated with Cipro and Flagyl.  She will need to follow-up with GI and she was given contact information.  She is to return to the emergency department for symptoms of change or worsen if she is unable  to see primary care or the GI specialist.      ____________________________________________   FINAL CLINICAL IMPRESSION(S) / ED DIAGNOSES  Final diagnoses:  None     ED Discharge Orders    None       Note:  This document was prepared using Dragon voice recognition software and may include unintentional dictation errors.    Chinita Pester, FNP 08/31/18 1548    Governor Rooks, MD 08/31/18 [phone]

## 2018-09-04 DIAGNOSIS — R569 Unspecified convulsions: Secondary | ICD-10-CM | POA: Insufficient documentation

## 2018-09-09 ENCOUNTER — Other Ambulatory Visit: Payer: Self-pay

## 2018-09-09 DIAGNOSIS — R1084 Generalized abdominal pain: Secondary | ICD-10-CM | POA: Diagnosis not present

## 2018-09-09 DIAGNOSIS — Z5321 Procedure and treatment not carried out due to patient leaving prior to being seen by health care provider: Secondary | ICD-10-CM | POA: Insufficient documentation

## 2018-09-09 LAB — URINALYSIS, COMPLETE (UACMP) WITH MICROSCOPIC
Bacteria, UA: NONE SEEN
Bilirubin Urine: NEGATIVE
GLUCOSE, UA: NEGATIVE mg/dL
HGB URINE DIPSTICK: NEGATIVE
KETONES UR: NEGATIVE mg/dL
LEUKOCYTES UA: NEGATIVE
Nitrite: NEGATIVE
PROTEIN: NEGATIVE mg/dL
Specific Gravity, Urine: 1.004 — ABNORMAL LOW (ref 1.005–1.030)
pH: 7 (ref 5.0–8.0)

## 2018-09-09 LAB — COMPREHENSIVE METABOLIC PANEL
ALK PHOS: 60 U/L (ref 38–126)
ALT: 18 U/L (ref 0–44)
ANION GAP: 8 (ref 5–15)
AST: 17 U/L (ref 15–41)
Albumin: 4.2 g/dL (ref 3.5–5.0)
BILIRUBIN TOTAL: 0.1 mg/dL — AB (ref 0.3–1.2)
BUN: 10 mg/dL (ref 6–20)
CALCIUM: 9.2 mg/dL (ref 8.9–10.3)
CO2: 26 mmol/L (ref 22–32)
Chloride: 105 mmol/L (ref 98–111)
Creatinine, Ser: 0.64 mg/dL (ref 0.44–1.00)
GFR calc non Af Amer: >60 mL/min (ref >60)
Glucose, Bld: 119 mg/dL — ABNORMAL HIGH (ref 70–99)
Potassium: 3.7 mmol/L (ref 3.5–5.1)
SODIUM: 139 mmol/L (ref 135–145)
TOTAL PROTEIN: 7.8 g/dL (ref 6.5–8.1)

## 2018-09-09 LAB — CBC
HCT: 38.8 % (ref 36.0–46.0)
Hemoglobin: 12.1 g/dL (ref 12.0–15.0)
MCH: 27.6 pg (ref 26.0–34.0)
MCHC: 31.2 g/dL (ref 30.0–36.0)
MCV: 88.4 fL (ref 80.0–100.0)
NRBC: 0 % (ref 0.0–0.2)
PLATELETS: 329 10*3/uL (ref 150–400)
RBC: 4.39 MIL/uL (ref 3.87–5.11)
RDW: 13.5 % (ref 11.5–15.5)
WBC: 8.3 10*3/uL (ref 4.0–10.5)

## 2018-09-09 LAB — LIPASE, BLOOD: Lipase: 32 U/L (ref 11–51)

## 2018-09-09 LAB — POCT PREGNANCY, URINE: Preg Test, Ur: NEGATIVE

## 2018-09-09 NOTE — ED Triage Notes (Signed)
Pt comes via POV from home with c/o left sided abdominal pain and also states yellow discharge from rectum. Pt reports she took Miralax and has not had any relief.  Pt reports she was recently dx with colitis and prescribed antibiotics. Pt states it gets better but then starts up again.   Pt reports BM this am.

## 2018-09-10 ENCOUNTER — Emergency Department
Admission: EM | Admit: 2018-09-10 | Discharge: 2018-09-10 | Disposition: A | Discharge status: Home / Self Care | Payer: Medicaid Other | Attending: Emergency Medicine | Admitting: Emergency Medicine

## 2018-09-10 NOTE — ED Notes (Signed)
Pt up to desk to inquire about wait time; understands that no definitive time for treatment room can be given but informed there are just a few ahead of her by time; pt says her pain is some better and is considering leaving; encouraged pt to stay for results; pt has agreed to stay;

## 2018-09-10 NOTE — ED Notes (Signed)
Pt seen leaving the department with female visitor; will watch to see if they return

## 2018-09-11 ENCOUNTER — Telehealth: Payer: Self-pay | Admitting: Emergency Medicine

## 2018-09-11 NOTE — Telephone Encounter (Signed)
Called patient due to lwot to inquire about condition and follow up plans. Left message.   

## 2018-10-18 ENCOUNTER — Encounter: Payer: Self-pay | Admitting: Gastroenterology

## 2018-10-18 ENCOUNTER — Ambulatory Visit: Payer: Medicaid Other | Admitting: Gastroenterology

## 2018-10-18 ENCOUNTER — Other Ambulatory Visit: Payer: Self-pay

## 2018-10-18 VITALS — BP 110/74 | HR 123 | Ht 69.0 in | Wt 177.6 lb

## 2018-10-18 DIAGNOSIS — R935 Abnormal findings on diagnostic imaging of other abdominal regions, including retroperitoneum: Secondary | ICD-10-CM

## 2018-10-18 DIAGNOSIS — K581 Irritable bowel syndrome with constipation: Secondary | ICD-10-CM | POA: Diagnosis not present

## 2018-10-18 NOTE — Progress Notes (Signed)
Wyline MoodKiran Jadian Karman MD, MRCP(U.K) [address]  [address]  [place], [place] [address]  Main: [phone]  Fax: [phone]   Gastroenterology Consultation  Referring Provider:  ER Primary Care Physician:  Patient, No Pcp Per Primary Gastroenterologist:  Dr. Wyline MoodKiran Laityn Bensen  Reason for Consultation:     ER visit         HPI:   Regina [name] is a [age] y.o. y/o female initially seen at the ER on 08/20/18 for abdominal pain , CT abdomen performed in the ER showed mild wall thickening of the descending colon , was discharged, returned to the ER on 08/31/18 , after a course of antibiotics.  Given colace and referred to GI.   Labs 09/09/18 : CBC-normal , UA normal , lipase,CMP normal , elevated glucose.    She says that she cannot have a bowel movement unless she drinks miralax or fiber, has a bowel movement once a week , hurts , does not think very hard. Feels much better after a bowel movement , has abdominal pain before a bowel movement, lower abdominal , goes to her side. Ongoing her whole life. She has seen some blood in her stool. Lost no weight , feel bloated. Mother had thyroid issues. Grand mother had colon cancer.    [facility] and has stopped.    Past Medical History:  Diagnosis Date  . ADHD (attention deficit hyperactivity disorder)   . Arthritis   . Scoliosis     Past Surgical History:  Procedure Laterality Date  . BREAST SURGERY Right    Cyst removal    Prior to Admission medications   Medication Sig Start Date End Date Taking? Authorizing Provider  Aspirin-Salicylamide-Caffeine (BC HEADACHE POWDER PO) Take 1 packet by mouth daily as needed. For pain    [provider]  butalbital-acetaminophen-caffeine (FIORICET, ESGIC) [phone] MG tablet Take 1-2 tablets by mouth every 6 (six) hours as needed for headache. 03/20/18 03/20/19  Schaevitz, Myra Rudeavid Matthew, MD  ciprofloxacin (CIPRO) 500 MG tablet Take 1 tablet (500 mg total) by mouth 2 (two)  times daily. 08/31/18   Triplett, Cari B, FNP  CRYSELLE-28 0.3-30 MG-MCG tablet Take 1 tablet by mouth daily.    [provider]  diphenhydrAMINE (SOMINEX) 25 MG tablet Take 25 mg by mouth at bedtime as needed for allergies or sleep.    [provider]  docusate sodium (COLACE) 100 MG capsule Take 1 capsule (100 mg total) by mouth daily. 08/31/18   Triplett, Rulon Eisenmengerari B, FNP  doxycycline (VIBRAMYCIN) 100 MG capsule Take 1 capsule (100 mg total) by mouth 2 (two) times daily. Patient not taking: Reported on 01/27/2017 02/16/16   Marlon PelGreene, Tiffany, PA-C  metroNIDAZOLE (FLAGYL) 500 MG tablet Take 1 tablet (500 mg total) by mouth 2 (two) times daily after a meal. 08/31/18   Triplett, Cari B, FNP  oxyCODONE-acetaminophen (PERCOCET/ROXICET) 5-325 MG tablet Take 1 tablet by mouth every 6 (six) hours as needed for severe pain. Patient not taking: Reported on 01/27/2017 02/16/16   Marlon PelGreene, Tiffany, PA-C    No family history on file.   Social History   Tobacco Use  . Smoking status: Never Smoker  . Smokeless tobacco: Never Used  Substance Use Topics  . Alcohol use: No  . Drug use: Yes    Types: Cocaine    Allergies as of 10/18/2018  . (No Known Allergies)    Review of Systems:    All systems reviewed and negative except where noted  in HPI.   Physical Exam:  BP 110/74   Pulse (!) 123   Ht 5\' 9"  (1.753 m)   Wt 177 lb 9.6 oz (80.6 kg)   BMI 26.23 kg/m  No LMP recorded. Patient has had an implant. Psych:  Alert and cooperative. Normal mood and affect. General:   Alert,  Well-developed, well-nourished, pleasant and cooperative in NAD Head:  Normocephalic and atraumatic. Eyes:  Sclera clear, no icterus.   Conjunctiva pink. Ears:  Normal auditory acuity. Nose:  No deformity, discharge, or lesions. Mouth:  No deformity or lesions,oropharynx pink & moist. Neck:  Supple; no masses or thyromegaly. Lungs:  Respirations even and unlabored.  Clear throughout to auscultation.   No wheezes,  crackles, or rhonchi. No acute distress. Heart:  Regular rate and rhythm; no murmurs, clicks, rubs, or gallops. Abdomen:  Normal bowel sounds.  No bruits.  Soft, non-tender and non-distended without masses, hepatosplenomegaly or hernias noted.  No guarding or rebound tenderness.    Neurologic:  Alert and oriented x3;  grossly normal neurologically. Skin:  Intact without significant lesions or rashes. No jaundice. Lymph Nodes:  No significant cervical adenopathy. Psych:  Alert and cooperative. Normal mood and affect.  Imaging Studies: No results found.  Assessment and Plan:   Regina [name] is a 27 y.o. 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   Plan  1. Daily trulance- 1 week samples provided 2. High fiber diet patient information  3. Colonoscopy to evaluate abnormal appearance seen on Ct scan  4. Check TSH  I have discussed alternative options, risks & benefits,  which include, but are not limited to, bleeding, infection, perforation,respiratory complication & drug reaction.  The patient agrees with this plan & written consent will be obtained.     Follow up in 4 weeks  Dr Wyline Mood MD,MRCP(U.K)

## 2018-10-18 NOTE — Patient Instructions (Signed)
High-Fiber Diet  Fiber, also called dietary fiber, is a type of carbohydrate found in fruits, vegetables, whole grains, and beans. A high-fiber diet can have many health benefits. Your health care provider may recommend a high-fiber diet to help:  · Prevent constipation. Fiber can make your bowel movements more regular.  · Lower your cholesterol.  · Relieve hemorrhoids, uncomplicated diverticulosis, or irritable bowel syndrome.  · Prevent overeating as part of a weight-loss plan.  · Prevent heart disease, type 2 diabetes, and certain cancers.    What is my plan?  The recommended daily intake of fiber includes:  · 38 grams for men under age 50.  · 30 grams for men over age 50.  · 25 grams for women under age 50.  · 21 grams for women over age 50.    You can get the recommended daily intake of dietary fiber by eating a variety of fruits, vegetables, grains, and beans. Your health care provider may also recommend a fiber supplement if it is not possible to get enough fiber through your diet.  What do I need to know about a high-fiber diet?  · Fiber supplements have not been widely studied for their effectiveness, so it is better to get fiber through food sources.  · Always check the fiber content on the nutrition facts label of any prepackaged food. Look for foods that contain at least 5 grams of fiber per serving.  · Ask your dietitian if you have questions about specific foods that are related to your condition, especially if those foods are not listed in the following section.  · Increase your daily fiber consumption gradually. Increasing your intake of dietary fiber too quickly may cause bloating, cramping, or gas.  · Drink plenty of water. Water helps you to digest fiber.  What foods can I eat?  Grains  Whole-grain breads. Multigrain cereal. Oats and oatmeal. Brown rice. Barley. Bulgur wheat. Millet. Bran muffins. Popcorn. Rye wafer crackers.  Vegetables   Sweet potatoes. Spinach. Kale. Artichokes. Cabbage. Broccoli. Green peas. Carrots. Squash.  Fruits  Berries. Pears. Apples. Oranges. Avocados. Prunes and raisins. Dried figs.  Meats and Other Protein Sources  Navy, kidney, pinto, and soy beans. Split peas. Lentils. Nuts and seeds.  Dairy  Fiber-fortified yogurt.  Beverages  Fiber-fortified soy milk. Fiber-fortified orange juice.  Other  Fiber bars.  The items listed above may not be a complete list of recommended foods or beverages. Contact your dietitian for more options.  What foods are not recommended?  Grains  White bread. Pasta made with refined flour. White rice.  Vegetables  Fried potatoes. Canned vegetables. Well-cooked vegetables.  Fruits  Fruit juice. Cooked, strained fruit.  Meats and Other Protein Sources  Fatty cuts of meat. Fried poultry or fried fish.  Dairy  Milk. Yogurt. Cream cheese. Sour cream.  Beverages  Soft drinks.  Other  Cakes and pastries. Butter and oils.  The items listed above may not be a complete list of foods and beverages to avoid. Contact your dietitian for more information.  What are some tips for including high-fiber foods in my diet?  · Eat a wide variety of high-fiber foods.  · Make sure that half of all grains consumed each day are whole grains.  · Replace breads and cereals made from refined flour or white flour with whole-grain breads and cereals.  · Replace white rice with brown rice, bulgur wheat, or millet.  · Start the day with a breakfast that is high in fiber,   such as a cereal that contains at least 5 grams of fiber per serving.  · Use beans in place of meat in soups, salads, or pasta.  · Eat high-fiber snacks, such as berries, raw vegetables, nuts, or popcorn.  This information is not intended to replace advice given to you by your health care provider. Make sure you discuss any questions you have with your health care provider.  Document Released: 10/25/2005 Document Revised: 04/01/2016 Document Reviewed: 04/09/2014   Elsevier Interactive Patient Education © 2018 [employer] Inc[employer]

## 2018-10-19 ENCOUNTER — Encounter: Payer: Self-pay | Admitting: Gastroenterology

## 2018-10-19 LAB — TSH: TSH: 1.36 u[IU]/mL (ref 0.450–4.500)

## 2018-10-23 ENCOUNTER — Ambulatory Visit: Payer: Medicaid Other | Admitting: Anesthesiology

## 2018-10-23 ENCOUNTER — Encounter: Payer: Self-pay | Admitting: *Deleted

## 2018-10-23 ENCOUNTER — Ambulatory Visit
Admission: RE | Admit: 2018-10-23 | Discharge: 2018-10-23 | Disposition: A | Discharge status: Home / Self Care | Payer: Medicaid Other | Attending: Gastroenterology | Admitting: Gastroenterology

## 2018-10-23 ENCOUNTER — Encounter
Admission: RE | Disposition: A | Discharge status: Home / Self Care | Payer: Self-pay | Source: Home / Self Care | Attending: Gastroenterology

## 2018-10-23 DIAGNOSIS — R935 Abnormal findings on diagnostic imaging of other abdominal regions, including retroperitoneum: Secondary | ICD-10-CM

## 2018-10-23 DIAGNOSIS — Z793 Long term (current) use of hormonal contraceptives: Secondary | ICD-10-CM | POA: Insufficient documentation

## 2018-10-23 DIAGNOSIS — R933 Abnormal findings on diagnostic imaging of other parts of digestive tract: Secondary | ICD-10-CM | POA: Diagnosis not present

## 2018-10-23 DIAGNOSIS — K581 Irritable bowel syndrome with constipation: Secondary | ICD-10-CM

## 2018-10-23 HISTORY — PX: COLONOSCOPY WITH PROPOFOL: SHX5780

## 2018-10-23 LAB — URINE DRUG SCREEN, QUALITATIVE (ARMC ONLY)
AMPHETAMINES, UR SCREEN: NOT DETECTED
Barbiturates, Ur Screen: NOT DETECTED
Benzodiazepine, Ur Scrn: NOT DETECTED
Cannabinoid 50 Ng, Ur ~~LOC~~: NOT DETECTED
Cocaine Metabolite,Ur ~~LOC~~: NOT DETECTED
MDMA (Ecstasy)Ur Screen: NOT DETECTED
Methadone Scn, Ur: NOT DETECTED
Opiate, Ur Screen: NOT DETECTED
Phencyclidine (PCP) Ur S: NOT DETECTED
Tricyclic, Ur Screen: POSITIVE — AB

## 2018-10-23 SURGERY — COLONOSCOPY WITH PROPOFOL
Anesthesia: General

## 2018-10-23 MED ORDER — MIDAZOLAM HCL 2 MG/2ML IJ SOLN
INTRAMUSCULAR | Status: DC | PRN
  Administered 2018-10-23: 2 mg via INTRAVENOUS

## 2018-10-23 MED ORDER — PROPOFOL 500 MG/50ML IV EMUL
INTRAVENOUS | Status: AC
  Filled 2018-10-23: qty 50

## 2018-10-23 MED ORDER — LIDOCAINE HCL (PF) 2 % IJ SOLN
INTRAMUSCULAR | Status: AC
  Filled 2018-10-23: qty 10

## 2018-10-23 MED ORDER — PROPOFOL 500 MG/50ML IV EMUL
INTRAVENOUS | Status: DC | PRN
  Administered 2018-10-23: 150 ug/kg/min via INTRAVENOUS

## 2018-10-23 MED ORDER — SODIUM CHLORIDE 0.9 % IV SOLN
INTRAVENOUS | Status: DC
  Administered 2018-10-23: 12:00:00 via INTRAVENOUS

## 2018-10-23 MED ORDER — PROPOFOL 10 MG/ML IV BOLUS
INTRAVENOUS | Status: DC | PRN
  Administered 2018-10-23: 80 mg via INTRAVENOUS

## 2018-10-23 MED ORDER — MIDAZOLAM HCL 2 MG/2ML IJ SOLN
INTRAMUSCULAR | Status: AC
  Filled 2018-10-23: qty 2

## 2018-10-23 NOTE — Anesthesia Procedure Notes (Signed)
Date/Time: 10/23/2018 11:56 AM Performed by: Stormy Fabianurtis, Ferris Fielden, CRNA Pre-anesthesia Checklist: Patient identified, Emergency Drugs available, Suction available and Patient being monitored Patient Re-evaluated:Patient Re-evaluated prior to induction Oxygen Delivery Method: Nasal cannula Induction Type: IV induction Dental Injury: Teeth and Oropharynx as per pre-operative assessment  Comments: Nasal cannula with etCO2 monitoring

## 2018-10-23 NOTE — H&P (Signed)
Wyline Mood, MD [address], [place], [place], [address] [address], [place], [place], [address] Phone: [phone]  Fax: [phone]  Primary Care Physician:  [facility]   Pre-Procedure History & Physical: HPI:  Regina [name] is a 27 y.o. female is here for an colonoscopy.   Past Medical History:  Diagnosis Date  . ADHD (attention deficit hyperactivity disorder)   . Arthritis   . Scoliosis     Past Surgical History:  Procedure Laterality Date  . BREAST SURGERY Right    Cyst removal    Prior to Admission medications   Medication Sig Start Date End Date Taking? Authorizing Provider  Aspirin-Salicylamide-Caffeine (BC HEADACHE POWDER PO) Take 1 packet by mouth daily as needed. For pain   Yes [provider]  nortriptyline (PAMELOR) 10 MG capsule TAKE 1 TO 2 CAPSULES BY MOUTH WITH THE 50MG  CAPS DAILY 10/07/18  Yes [provider]  nortriptyline (PAMELOR) 50 MG capsule TAKE 1 CAPSULE BY MOUTH EVERY DAY IN THE EVENING 09/07/18  Yes [provider]  butalbital-acetaminophen-caffeine (FIORICET, ESGIC) 50-325-40 MG tablet Take 1-2 tablets by mouth every 6 (six) hours as needed for headache. Patient not taking: Reported on 10/18/2018 03/20/18 03/20/19  Myrna Blazer, MD  ciprofloxacin (CIPRO) 500 MG tablet Take 1 tablet (500 mg total) by mouth 2 (two) times daily. Patient not taking: Reported on 10/18/2018 08/31/18   Kem Boroughs B, FNP  CRYSELLE-28 0.3-30 MG-MCG tablet Take 1 tablet by mouth daily.    [provider]  diphenhydrAMINE (SOMINEX) 25 MG tablet Take 25 mg by mouth at bedtime as needed for allergies or sleep.    [provider]  docusate sodium (COLACE) 100 MG capsule Take 1 capsule (100 mg total) by mouth daily. Patient not taking: Reported on 10/18/2018 08/31/18   Kem Boroughs B, FNP  doxycycline (VIBRAMYCIN) 100 MG capsule Take 1 capsule (100 mg total) by mouth 2  (two) times daily. Patient not taking: Reported on 01/27/2017 02/16/16   Marlon Pel, PA-C  metroNIDAZOLE (FLAGYL) 500 MG tablet Take 1 tablet (500 mg total) by mouth 2 (two) times daily after a meal. Patient not taking: Reported on 10/18/2018 08/31/18   Kem Boroughs B, FNP  oxyCODONE-acetaminophen (PERCOCET/ROXICET) 5-325 MG tablet Take 1 tablet by mouth every 6 (six) hours as needed for severe pain. Patient not taking: Reported on 01/27/2017 02/16/16   Marlon Pel, PA-C    Allergies as of 10/18/2018  . (No Known Allergies)    No family history on file.  Social History   Socioeconomic History  . Marital status: Single    Spouse name: Not on file  . Number of children: Not on file  . Years of education: Not on file  . Highest education level: Not on file  Occupational History  . Not on file  Social Needs  . Financial resource strain: Not on file  . Food insecurity:    Worry: Not on file    Inability: Not on file  . Transportation needs:    Medical: Not on file    Non-medical: Not on file  Tobacco Use  . Smoking status: Never Smoker  . Smokeless tobacco: Never Used  Substance and Sexual Activity  . Alcohol use: No  . Drug use: Yes    Types: Cocaine  . Sexual activity: Yes    Birth control/protection: Implant  Lifestyle  . Physical activity:    Days per week: Not on file  Minutes per session: Not on file  . Stress: Not on file  Relationships  . Social connections:    Talks on phone: Not on file    Gets together: Not on file    Attends religious service: Not on file    Active member of club or organization: Not on file    Attends meetings of clubs or organizations: Not on file    Relationship status: Not on file  . Intimate partner violence:    Fear of current or ex partner: Not on file    Emotionally abused: Not on file    Physically abused: Not on file    Forced sexual activity: Not on file  Other Topics Concern  . Not on file  Social History  Narrative  . Not on file    Review of Systems: See HPI, otherwise negative ROS  Physical Exam: BP 120/81   Pulse (!) 104   Temp 98.2 F (36.8 C) (Tympanic)   Resp 18   Ht 5\' 9"  (1.753 m)   Wt 80 kg   SpO2 100%   BMI 26.05 kg/m  General:   Alert,  pleasant and cooperative in NAD Head:  Normocephalic and atraumatic. Neck:  Supple; no masses or thyromegaly. Lungs:  Clear throughout to auscultation, normal respiratory effort.    Heart:  +S1, +S2, Regular rate and rhythm, No edema. Abdomen:  Soft, nontender and nondistended. Normal bowel sounds, without guarding, and without rebound.   Neurologic:  Alert and  oriented x4;  grossly normal neurologically.  Impression/Plan: Regina [name] is here for an colonoscopy to be performed for abnormal CT scan   Risks, benefits, limitations, and alternatives regarding  colonoscopy have been reviewed with the patient.  Questions have been answered.  All parties agreeable.   Wyline MoodKiran Smitty Ackerley, MD  10/23/2018, 10:40 AM

## 2018-10-23 NOTE — Op Note (Signed)
[facility] Gastroenterology Patient Name: Regina [name] Procedure Date: 10/23/2018 10:44 AM MRN: [id number] Account #: [id number] Date of Birth: [birth year] Admit Type: Outpatient Age: 27 Room: [facility] ENDO ROOM 4 Gender: Female Note Status: Finalized Procedure:            Colonoscopy Indications:          Abnormal CT of the GI tract Providers:            Wyline MoodKiran Adalid Beckmann MD, MD Referring MD:         No Local Md, MD (Referring MD) Medicines:            Monitored Anesthesia Care Complications:        No immediate complications. Procedure:            Pre-Anesthesia Assessment:                       - Prior to the procedure, a History and Physical was                        performed, and patient medications, allergies and                        sensitivities were reviewed. The patient's tolerance of                        previous anesthesia was reviewed.                       - The risks and benefits of the procedure and the                        sedation options and risks were discussed with the                        patient. All questions were answered and informed                        consent was obtained.                       - ASA Grade Assessment: II - A patient with mild                        systemic disease.                       After obtaining informed consent, the colonoscope was                        passed under direct vision. Throughout the procedure,                        the patient's blood pressure, pulse, and oxygen                        saturations were monitored continuously. The                        Colonoscope was introduced through the anus and  advanced to the the cecum, identified by the                        appendiceal orifice, IC valve and transillumination.                        The colonoscopy was performed with ease. The patient                        tolerated the procedure well. The quality of the bowel                         preparation was good. Findings:      The entire examined colon appeared normal on direct and retroflexion       views. Impression:           - The entire examined colon is normal on direct and                        retroflexion views.                       - No specimens collected. Recommendation:       - Discharge patient to home (with escort).                       - Resume previous diet.                       - Continue present medications.                       - Return to my office in 2 weeks. Procedure Code(s):    --- Professional ---                       [phone], Colonoscopy, flexible; diagnostic, including                        collection of specimen(s) by brushing or washing, when                        performed (separate procedure) Diagnosis Code(s):    --- Professional ---                       R93.3, Abnormal findings on diagnostic imaging of other                        parts of digestive tract CPT copyright 2018 American Medical Association. All rights reserved. The codes documented in this report are preliminary and upon coder review may  be revised to meet current compliance requirements. Wyline Mood, MD Wyline Mood MD, MD 10/23/2018 12:10:23 PM This report has been signed electronically. Number of Addenda: 0 Note Initiated On: 10/23/2018 10:44 AM Scope Withdrawal Time: 0 hours 6 minutes 40 seconds  Total Procedure Duration: 0 hours 10 minutes 5 seconds       [facility]

## 2018-10-23 NOTE — Anesthesia Post-op Follow-up Note (Signed)
Anesthesia QCDR form completed.        

## 2018-10-23 NOTE — Anesthesia Preprocedure Evaluation (Addendum)
Anesthesia Evaluation  Patient identified by MRN, date of birth, ID band Patient awake    Reviewed: Allergy & Precautions, H&P , NPO status , Patient's Chart, lab work & pertinent test results  Airway Mallampati: II      Comment: Small chin Dental  (+) Chipped   Pulmonary Current Smoker (vaping),           Cardiovascular negative cardio ROS       Neuro/Psych negative neurological ROS  negative psych ROS   GI/Hepatic negative GI ROS, Neg liver ROS,   Endo/Other  negative endocrine ROS  Renal/GU negative Renal ROS  negative genitourinary   Musculoskeletal  (+) Arthritis ,   Abdominal   Peds  Hematology negative hematology ROS (+)   Anesthesia Other Findings Past Medical History: No date: ADHD (attention deficit hyperactivity disorder) No date: Arthritis No date: Scoliosis  Past Surgical History: No date: BREAST SURGERY; Right     Comment:  Cyst removal  BMI    Body Mass Index:  26.05 kg/m      Reproductive/Obstetrics negative OB ROS                            Anesthesia Physical Anesthesia Plan  ASA: II  Anesthesia Plan: General   Post-op Pain Management:    Induction:   PONV Risk Score and Plan: Propofol infusion and TIVA  Airway Management Planned: Natural Airway and Nasal Cannula  Additional Equipment:   Intra-op Plan:   Post-operative Plan:   Informed Consent: I have reviewed the patients History and Physical, chart, labs and discussed the procedure including the risks, benefits and alternatives for the proposed anesthesia with the patient or authorized representative who has indicated his/her understanding and acceptance.   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  If negative, ok to proceed.)      Anesthesia Quick  Evaluation

## 2018-10-23 NOTE — Transfer of Care (Signed)
Immediate Anesthesia Transfer of Care Note  Patient: Regina [name]  Procedure(s) Performed: Procedure(s): COLONOSCOPY WITH PROPOFOL (N/A)  Patient Location: PACU and Endoscopy Unit  Anesthesia Type:General  Level of Consciousness: sedated  Airway & Oxygen Therapy: Patient Spontanous Breathing and Patient connected to nasal cannula oxygen  Post-op Assessment: Report given to RN and Post -op Vital signs reviewed and stable  Post vital signs: Reviewed and stable  Last Vitals:  Vitals:   10/23/18 1009 10/23/18 1215  BP: 120/81 102/82  Pulse: (!) 104 96  Resp: 18 17  Temp: 36.8 C 36.7 C  SpO2: 100% 100%    Complications: No apparent anesthesia complications

## 2018-10-24 NOTE — Anesthesia Postprocedure Evaluation (Signed)
Anesthesia Post Note  Patient: Regina [name]  Procedure(s) Performed: COLONOSCOPY WITH PROPOFOL (N/A )  Patient location during evaluation: PACU Anesthesia Type: General Level of consciousness: awake and alert Pain management: pain level controlled Vital Signs Assessment: post-procedure vital signs reviewed and stable Respiratory status: spontaneous breathing, nonlabored ventilation, respiratory function stable and patient connected to nasal cannula oxygen Cardiovascular status: blood pressure returned to baseline and stable Postop Assessment: no apparent nausea or vomiting Anesthetic complications: no     Last Vitals:  Vitals:   10/23/18 1224 10/23/18 1234  BP: 104/79 110/80  Pulse: 86 82  Resp: 16 15  Temp:    SpO2: 100% 100%    Last Pain:  Vitals:   10/23/18 1234  TempSrc:   PainSc: 0-No pain                 Jovita GammaKathryn L Fitzgerald

## 2018-10-30 ENCOUNTER — Telehealth: Payer: Self-pay | Admitting: Gastroenterology

## 2018-10-30 NOTE — Telephone Encounter (Signed)
Pt is calling she states she has been vomiting and having sharp pain where here ribs are on the right side   She would like a different rx  The one she has been on is not working please call pt

## 2018-10-30 NOTE — Telephone Encounter (Signed)
Spoke with pt regarding her complaint of vomiting and abdominal pain. Pt states one day after having the colonoscopy procedure she began experiencing nausea, some vomiting, painful acid reflux, abdominal pain, and bloating. Pt also states she's still not having regular bowel movements. She states the Trulance has caused "watery stool" and has not improved the frequency of bowel movements. Pt requests advice on what to do. I explained that I will relay this information to Dr. Tobi BastosAnna.

## 2018-10-31 NOTE — Telephone Encounter (Signed)
Try motegrity - give samples. Has the vomiting stopped?

## 2018-11-03 NOTE — Telephone Encounter (Signed)
Spoke with Regina [name] and informed her of Dr. Johnney KillianAnna's suggestion for Regina [name] to start a trial of Motegrity in place of the Trulance. Regina [name] agrees and plans to come by the office for samples today.

## 2018-11-24 ENCOUNTER — Telehealth: Payer: Self-pay | Admitting: Gastroenterology

## 2018-11-24 NOTE — Telephone Encounter (Signed)
Patient called was given samples (did not know the name of the medication) they did not work .She is not able to use the bathroom .Still having problems. Please advise

## 2018-11-24 NOTE — Telephone Encounter (Signed)
Try linzess 290 give samples

## 2018-11-27 NOTE — Telephone Encounter (Signed)
Spoke with pt and informed her of Dr. Johnney Killian suggestion for pt to try Linzess 290 mcg. I have offered samples, pt plans to pick up the samples this week.

## 2018-12-06 ENCOUNTER — Encounter: Payer: Self-pay | Admitting: Gastroenterology

## 2018-12-06 ENCOUNTER — Ambulatory Visit: Payer: Medicaid Other | Admitting: Gastroenterology

## 2018-12-06 VITALS — BP 109/73 | HR 105 | Ht 69.0 in | Wt 182.6 lb

## 2018-12-06 DIAGNOSIS — K581 Irritable bowel syndrome with constipation: Secondary | ICD-10-CM | POA: Diagnosis not present

## 2018-12-06 NOTE — Progress Notes (Signed)
Wyline Mood MD, MRCP(U.K) [address]  [address]  [place], [place] [address]  Main: [phone]  Fax: [phone]   Primary Care Physician: [facility]  Primary Gastroenterologist:  Dr. Wyline Mood   Chief Complaint  Patient presents with  . Follow-up    Irritable Bowel Syndrome with constipation    HPI: Regina [name] is a 28 y.o. female    Summary of history :  She was iniially referred and seen on 10/18/18 after being seen at the ER on 08/20/18 for abdominal pain , CT abdomen performed in the ER showed mild wall thickening of the descending colon , was discharged, returned to the ER on 08/31/18 , after a course of antibiotics.  Given colace and referred to GI. Labs 09/09/18 : CBC-normal , UA normal , lipase,CMP normal , elevated glucose.    She says that she cannot have a bowel movement unless she drinks miralax or fiber, has a bowel movement once a week , hurts , does not think very hard. Feels much better after a bowel movement , has abdominal pain before a bowel movement, lower abdominal , goes to her side. Ongoing her whole life. She has seen some blood in her stool. Lost no weight , feel bloated. Mother had thyroid issues. Grand mother had colon cancer.    Previously used to take [facility] powders and has stopped.    Interval history   10/18/2018-12/06/2018  10/18/2018 : TSH-normal 10/23/18: colonoscopy -normal  Says the Trulance caused diarrhea- changed to Linzess 290 mcg - lots of gas did not take daily, had some diarrhea- history not consistent    Current Outpatient Medications  Medication Sig Dispense Refill  . Fremanezumab-vfrm 225 MG/1.5ML SOSY Inject into the skin.    Marland Kitchen AIMOVIG 70 MG/ML SOAJ INJECT 70 MG SUBCUTANEOUSLY EVERY 28 (TWENTY-EIGHT) DAYS    . Aspirin-Salicylamide-Caffeine (BC HEADACHE POWDER PO) Take 1 packet by mouth daily as needed. For pain    . butalbital-acetaminophen-caffeine (FIORICET, ESGIC) 50-325-40 MG tablet Take  1-2 tablets by mouth every 6 (six) hours as needed for headache. (Patient not taking: Reported on 10/18/2018) 20 tablet 0  . ciprofloxacin (CIPRO) 500 MG tablet Take 1 tablet (500 mg total) by mouth 2 (two) times daily. (Patient not taking: Reported on 10/18/2018) 14 tablet 0  . CRYSELLE-28 0.3-30 MG-MCG tablet Take 1 tablet by mouth daily.    . diphenhydrAMINE (SOMINEX) 25 MG tablet Take 25 mg by mouth at bedtime as needed for allergies or sleep.    Marland Kitchen docusate sodium (COLACE) 100 MG capsule Take 1 capsule (100 mg total) by mouth daily. (Patient not taking: Reported on 10/18/2018) 10 capsule 0  . doxycycline (VIBRAMYCIN) 100 MG capsule Take 1 capsule (100 mg total) by mouth 2 (two) times daily. (Patient not taking: Reported on 01/27/2017) 28 capsule 0  . metroNIDAZOLE (FLAGYL) 500 MG tablet Take 1 tablet (500 mg total) by mouth 2 (two) times daily after a meal. (Patient not taking: Reported on 10/18/2018) 14 tablet 0  . nortriptyline (PAMELOR) 10 MG capsule TAKE 1 TO 2 CAPSULES BY MOUTH WITH THE 50MG  CAPS DAILY  3  . nortriptyline (PAMELOR) 50 MG capsule TAKE 1 CAPSULE BY MOUTH EVERY DAY IN THE EVENING  3  . oxyCODONE-acetaminophen (PERCOCET/ROXICET) 5-325 MG tablet Take 1 tablet by mouth every 6 (six) hours as needed for severe pain. (Patient not taking: Reported on 01/27/2017) 20 tablet 0   No current facility-administered medications for this visit.  Allergies as of 12/06/2018  . (No Known Allergies)    ROS:  General: Negative for anorexia, weight loss, fever, chills, fatigue, weakness. ENT: Negative for hoarseness, difficulty swallowing , nasal congestion. CV: Negative for chest pain, angina, palpitations, dyspnea on exertion, peripheral edema.  Respiratory: Negative for dyspnea at rest, dyspnea on exertion, cough, sputum, wheezing.  GI: See history of present illness. GU:  Negative for dysuria, hematuria, urinary incontinence, urinary frequency, nocturnal urination.  Endo: Negative  for unusual weight change.    Physical Examination:   BP 109/73   Pulse (!) 105   Ht 5\' 9"  (1.753 m)   Wt 182 lb 9.6 oz (82.8 kg)   BMI 26.97 kg/m   General: Well-nourished, well-developed in no acute distress.  Eyes: No icterus. Conjunctivae pink. Mouth: Oropharyngeal mucosa moist and pink , no lesions erythema or exudate. Lungs: Clear to auscultation bilaterally. Non-labored. Heart: Regular rate and rhythm, no murmurs rubs or gallops.  Abdomen: Bowel sounds are normal, nontender, nondistended, no hepatosplenomegaly or masses, no abdominal bruits or hernia , no rebound or guarding.   Extremities: No lower extremity edema. No clubbing or deformities. Neuro: Alert and oriented x 3.  Grossly intact. Skin: Warm and dry, no jaundice.   Psych: Alert and cooperative, normal mood and affect.   Imaging Studies: No results found.  Assessment and Plan:   Regina [name] is a 28 y.o. y/o female here to follow up for severe IBS-C.   Plan  1. Restart linzess today 290 mcg dose- stressed on need to take daily and not PRN 2 . Her history is not consistent- suggested to maintain a diary with notes regarding dates when she had a bowel movement and any other issues and bring to next visit   Dr Wyline Mood  MD,MRCP [facility]) Follow up in 3 weeks

## 2018-12-13 ENCOUNTER — Telehealth: Payer: Self-pay | Admitting: Gastroenterology

## 2018-12-13 NOTE — Telephone Encounter (Signed)
PT fiance is calling in regards to pt she was  given  Samples of Linsetts and they have been working for her until today pt has Diarrhea and stomach pains please call pt Fiance he is on [place]  cb [phone]

## 2018-12-20 ENCOUNTER — Telehealth: Payer: Self-pay | Admitting: Gastroenterology

## 2018-12-20 NOTE — Telephone Encounter (Signed)
Spoke with pt she states she's had abdominal pain with very few bowel movements for the past week. Pt states she's taking the Linzess but she has only had a few bowel movements, which were very loose/watery, since beginning the Linzess. Pt plans to discuss this with Dr. Tobi Bastos during her office visit on 12-21-18.

## 2018-12-20 NOTE — Telephone Encounter (Signed)
Please return pt call 

## 2018-12-20 NOTE — Telephone Encounter (Signed)
I called patient to remind her of her appointment for 12-20-2018 with Dr Tobi Bastos. She ask me to let him know she is having sever pain & her BM'S are like coffee grounds.

## 2018-12-21 ENCOUNTER — Ambulatory Visit: Payer: Medicaid Other | Admitting: Gastroenterology

## 2018-12-21 VITALS — Wt 184.2 lb

## 2018-12-21 DIAGNOSIS — K581 Irritable bowel syndrome with constipation: Secondary | ICD-10-CM

## 2018-12-21 NOTE — Progress Notes (Signed)
Wyline Mood MD, MRCP(U.K) [address]  [address]  [place], [place] [address]  Main: [phone]  Fax: [phone]   Primary Care Physician: [facility]  Primary Gastroenterologist:  Dr. Wyline Mood   No chief complaint on file.   HPI: Regina [name] is a 28 y.o. female    Summary of history :  She was iniially referred and seen on 10/18/18 after being seen at the ER on 08/20/18 for abdominal pain , CT abdomen performed in the ER showed mild wall thickening of the descending colon , was discharged, returned to the ER on 08/31/18 , after a course of antibiotics. Given colace and referred to GI. Labs 09/09/18 : CBC-normal , UA normal , lipase,CMP normal , elevated glucose.    She says that she cannot have a bowel movement unless she drinks miralax or fiber, has a bowel movement once a week , hurts , does not think very hard. Feels much better after a bowel movement , has abdominal pain before a bowel movement, lower abdominal , goes to her side. Ongoing her whole life. She has seen some blood in her stool. Lost no weight , feel bloated. Mother had thyroid issues. Grand mother had colon cancer.  10/18/2018 : TSH-normal 10/23/18: colonoscopy -normal  Previously used to take [facility] powders and has stopped.   Interval history  12/06/2018 -12/21/2018 Took the linzess daily - was initially working but later had no response and back to constipation.    On the day she didn't have the bowel movement had pain , small size .   Current Outpatient Medications  Medication Sig Dispense Refill  . AIMOVIG 70 MG/ML SOAJ INJECT 70 MG SUBCUTANEOUSLY EVERY 28 (TWENTY-EIGHT) DAYS    . Aspirin-Salicylamide-Caffeine (BC HEADACHE POWDER PO) Take 1 packet by mouth daily as needed. For pain    . butalbital-acetaminophen-caffeine (FIORICET, ESGIC) 50-325-40 MG tablet Take 1-2 tablets by mouth every 6 (six) hours as needed for headache. (Patient not taking: Reported on  10/18/2018) 20 tablet 0  . ciprofloxacin (CIPRO) 500 MG tablet Take 1 tablet (500 mg total) by mouth 2 (two) times daily. (Patient not taking: Reported on 10/18/2018) 14 tablet 0  . CRYSELLE-28 0.3-30 MG-MCG tablet Take 1 tablet by mouth daily.    . diphenhydrAMINE (SOMINEX) 25 MG tablet Take 25 mg by mouth at bedtime as needed for allergies or sleep.    Marland Kitchen docusate sodium (COLACE) 100 MG capsule Take 1 capsule (100 mg total) by mouth daily. (Patient not taking: Reported on 10/18/2018) 10 capsule 0  . doxycycline (VIBRAMYCIN) 100 MG capsule Take 1 capsule (100 mg total) by mouth 2 (two) times daily. (Patient not taking: Reported on 01/27/2017) 28 capsule 0  . Fremanezumab-vfrm 225 MG/1.5ML SOSY Inject into the skin.    Marland Kitchen metroNIDAZOLE (FLAGYL) 500 MG tablet Take 1 tablet (500 mg total) by mouth 2 (two) times daily after a meal. (Patient not taking: Reported on 10/18/2018) 14 tablet 0  . nortriptyline (PAMELOR) 10 MG capsule TAKE 1 TO 2 CAPSULES BY MOUTH WITH THE 50MG  CAPS DAILY  3  . nortriptyline (PAMELOR) 50 MG capsule TAKE 1 CAPSULE BY MOUTH EVERY DAY IN THE EVENING  3  . oxyCODONE-acetaminophen (PERCOCET/ROXICET) 5-325 MG tablet Take 1 tablet by mouth every 6 (six) hours as needed for severe pain. (Patient not taking: Reported on 01/27/2017) 20 tablet 0   No current facility-administered medications for this visit.     Allergies as of 12/21/2018  . (No  Known Allergies)    ROS:  General: Negative for anorexia, weight loss, fever, chills, fatigue, weakness. ENT: Negative for hoarseness, difficulty swallowing , nasal congestion. CV: Negative for chest pain, angina, palpitations, dyspnea on exertion, peripheral edema.  Respiratory: Negative for dyspnea at rest, dyspnea on exertion, cough, sputum, wheezing.  GI: See history of present illness. GU:  Negative for dysuria, hematuria, urinary incontinence, urinary frequency, nocturnal urination.  Endo: Negative for unusual weight change.      Physical Examination:   Wt 184 lb 3.2 oz (83.6 kg)   BMI 27.20 kg/m   General: Well-nourished, well-developed in no acute distress.  Eyes: No icterus. Conjunctivae pink. Mouth: Oropharyngeal mucosa moist and pink , no lesions erythema or exudate. Lungs: Clear to auscultation bilaterally. Non-labored. Heart: Regular rate and rhythm, no murmurs rubs or gallops.  Abdomen: Bowel sounds are normal, nontender, nondistended, no hepatosplenomegaly or masses, no abdominal bruits or hernia , no rebound or guarding.   Extremities: No lower extremity edema. No clubbing or deformities. Neuro: Alert and oriented x 3.  Grossly intact. Skin: Warm and dry, no jaundice.   Psych: Alert and cooperative, normal mood and affect.   Imaging Studies: No results found.  Assessment and Plan:   Regina [name] is a 28 y.o. y/o female  here to follow up for severe IBS-C. Trulance caused diarrhea. Linzess 290 didn't work. She is maintaining a diary, will try Motegrity and if it does not work will try Amitiza.     Dr Wyline Mood  MD,MRCP [facility]) Follow up in 4 weeks

## 2019-01-02 ENCOUNTER — Telehealth: Payer: Self-pay | Admitting: Gastroenterology

## 2019-01-02 NOTE — Telephone Encounter (Signed)
Pt is calling to Let Dr. Tobi Bastos know the samples she was giving are not working she states she has very bad Acid  And has not been able to use the bathroom please advise

## 2019-01-02 NOTE — Telephone Encounter (Signed)
Try amitiza- 24 mcg - either send in script for 4 weeks to try or she can come when we have samples

## 2019-01-03 ENCOUNTER — Other Ambulatory Visit: Payer: Self-pay

## 2019-01-03 MED ORDER — LUBIPROSTONE 24 MCG PO CAPS: 24.0000 ug | ORAL_CAPSULE | Freq: Two times a day (BID) | ORAL | 0 refills | Status: DC

## 2019-01-03 NOTE — Telephone Encounter (Signed)
Spoke with pt and informed her of Dr. Johnney Killian instructions for pt to commence on Amitiza 24 mcg. Pt agrees. Prescription has been sent to pt preferred pharmacy.

## 2019-01-31 ENCOUNTER — Other Ambulatory Visit: Payer: Self-pay | Admitting: Gastroenterology

## 2019-01-31 ENCOUNTER — Ambulatory Visit: Payer: Medicaid Other | Admitting: Gastroenterology

## 2019-02-13 NOTE — Telephone Encounter (Signed)
Pt Fiance left vm regarding pt rx Ametiza they need a refill  Please call pt

## 2019-02-14 DIAGNOSIS — G43119 Migraine with aura, intractable, without status migrainosus: Secondary | ICD-10-CM | POA: Insufficient documentation

## 2019-03-28 ENCOUNTER — Ambulatory Visit (INDEPENDENT_AMBULATORY_CARE_PROVIDER_SITE_OTHER): Payer: Medicaid Other | Admitting: Gastroenterology

## 2019-03-28 DIAGNOSIS — K581 Irritable bowel syndrome with constipation: Secondary | ICD-10-CM

## 2019-03-28 NOTE — Progress Notes (Signed)
Wyline MoodKiran Brion [name] , MD [address]  [address]  [place], [place] [address]  Main: [phone]  Fax: [phone]   Primary Care Physician: Carmon GinsbergPa, [facility]  Virtual Visit via Video Note  I connected with patient on 03/28/19 at  9:00 AM EDT by video and verified that I am speaking with the correct person using two identifiers.   I discussed the limitations, risks, security and privacy concerns of performing an evaluation and management service by video  and the availability of in person appointments. I also discussed with the patient that there may be a patient responsible charge related to this service. The patient expressed understanding and agreed to proceed.  Location of Patient: Home Location of Provider: Home Persons involved: Patient and provider only   History of Present Illness: Chief Complaint  Patient presents with  . Follow-up    Irritable bowel syndrome with constipation    HPI: Regina [name] is a 28 y.o. female  Summary of history :  She was iniially referred and seen on 10/18/18 after beingseen at the ER on 08/20/18 for abdominal pain , CT abdomen performed in the ER showed mild wall thickening of the descending colon , was discharged, returned to the ER on 08/31/18 , after a course of antibiotics. Given colace and referred to GI. Labs 09/09/18 : CBC-normal , UA normal , lipase,CMP normal , elevated glucose.    She says that she cannot have a bowel movement unless she drinks miralax or fiber, has a bowel movement once a week , hurts , does not think very hard. Feels much better after a bowel movement , has abdominal pain before a bowel movement, lower abdominal , goes to her side. Ongoing her whole life. She has seen some blood in her stool. Lost no weight , feel bloated. Mother had thyroid issues. Grand mother had colon cancer.   10/18/2018 : TSH-normal 10/23/18: colonoscopy -normal  [facility] and has stopped.    Interval history2/13/2020-03/28/2019  Took the linzess daily - was initially working but later had no response and back to constipation. Motegrity didn't work and tried Sales executiveAmitiza daily 24 mcg.   She says she has been taking the Amitiza, stopped working 2 weeks back- went 4-5 days without a bowel movement. Took miralax and had a bowel movement. Takes it every day with [place].    Current Outpatient Medications  Medication Sig Dispense Refill  . AIMOVIG 70 MG/ML SOAJ INJECT 70 MG SUBCUTANEOUSLY EVERY 28 (TWENTY-EIGHT) DAYS    . AMITIZA 24 MCG capsule TAKE 1 CAPSULE (24 MCG TOTAL) BY MOUTH 2 (TWO) TIMES DAILY WITH A MEAL FOR 30 DAYS. 60 capsule 3  . Aspirin-Salicylamide-Caffeine (BC HEADACHE POWDER PO) Take 1 packet by mouth daily as needed. For pain    . CRYSELLE-28 0.3-30 MG-MCG tablet Take 1 tablet by mouth daily.    . diphenhydrAMINE (SOMINEX) 25 MG tablet Take 25 mg by mouth at bedtime as needed for allergies or sleep.    . divalproex (DEPAKOTE) 125 MG DR tablet Take by mouth.    . Fremanezumab-vfrm 225 MG/1.5ML SOSY Inject into the skin.    Marland Kitchen. ketorolac (TORADOL) 10 MG tablet TAKE 1 TABLET (10 MG) BY MOUTH EVERY 6 HOURS AS NEEDED PAIN    . nortriptyline (PAMELOR) 10 MG capsule TAKE 1 TO 2 CAPSULES BY MOUTH WITH THE 50MG  CAPS DAILY  3  . nortriptyline (PAMELOR) 50 MG capsule TAKE 1 CAPSULE BY MOUTH EVERY DAY  IN THE EVENING  3  . ciprofloxacin (CIPRO) 500 MG tablet Take 1 tablet (500 mg total) by mouth 2 (two) times daily. (Patient not taking: Reported on 10/18/2018) 14 tablet 0  . docusate sodium (COLACE) 100 MG capsule Take 1 capsule (100 mg total) by mouth daily. (Patient not taking: Reported on 10/18/2018) 10 capsule 0  . doxycycline (VIBRAMYCIN) 100 MG capsule Take 1 capsule (100 mg total) by mouth 2 (two) times daily. (Patient not taking: Reported on 01/27/2017) 28 capsule 0  . metroNIDAZOLE (FLAGYL) 500 MG tablet Take 1 tablet (500 mg total) by mouth 2 (two) times daily after a meal.  (Patient not taking: Reported on 10/18/2018) 14 tablet 0  . oxyCODONE-acetaminophen (PERCOCET/ROXICET) 5-325 MG tablet Take 1 tablet by mouth every 6 (six) hours as needed for severe pain. (Patient not taking: Reported on 01/27/2017) 20 tablet 0   No current facility-administered medications for this visit.     Allergies as of 03/28/2019  . (No Known Allergies)    Review of Systems:    All systems reviewed and negative except where noted in HPI.  General Appearance:    Alert, cooperative, no distress, appears stated age  Head:    Normocephalic, without obvious abnormality, atraumatic  Eyes:    PERRL, conjunctiva/corneas clear,  Ears:    Grossly normal hearing    Neurologic:  Grossly normal    Observations/Objective:  Labs: CMP     Component Value Date/Time   NA 139 09/09/2018 2049   NA 139 07/09/2012 1838   K 3.7 09/09/2018 2049   K 3.2 (L) 07/09/2012 1838   CL 105 09/09/2018 2049   CL 106 07/09/2012 1838   CO2 26 09/09/2018 2049   CO2 28 07/09/2012 1838   GLUCOSE 119 (H) 09/09/2018 2049   GLUCOSE 91 07/09/2012 1838   BUN 10 09/09/2018 2049   BUN 9 07/09/2012 1838   CREATININE 0.64 09/09/2018 2049   CREATININE 0.76 07/09/2012 1838   CALCIUM 9.2 09/09/2018 2049   CALCIUM 8.8 07/09/2012 1838   PROT 7.8 09/09/2018 2049   PROT 8.0 07/09/2012 1838   ALBUMIN 4.2 09/09/2018 2049   ALBUMIN 4.0 07/09/2012 1838   AST 17 09/09/2018 2049   AST 17 07/09/2012 1838   ALT 18 09/09/2018 2049   ALT 14 07/09/2012 1838   ALKPHOS 60 09/09/2018 2049   ALKPHOS 70 07/09/2012 1838   BILITOT 0.1 (L) 09/09/2018 2049   BILITOT 0.3 07/09/2012 1838   GFRNONAA >60 09/09/2018 2049   GFRNONAA >60 07/09/2012 1838   GFRAA >60 09/09/2018 2049   GFRAA >60 07/09/2012 1838   Lab Results  Component Value Date   WBC 8.3 09/09/2018   HGB 12.1 09/09/2018   HCT 38.8 09/09/2018   MCV 88.4 09/09/2018   PLT 329 09/09/2018    Imaging Studies: No results found.  Assessment and Plan:   Regina [name] is a 28 y.o. y/o female here to follow up forsevere IBS-C. Trulance caused diarrhea. Linzess , Amitiza, motegrity didn't work . Miralax seemed to work .  Plan  1. Take miralax daily 1-2 times  2. F/u in 2 weeks if not doing well can change to trulance every other day or try lactulose.       I discussed the assessment and treatment plan with the patient. The patient was provided an opportunity to ask questions and all were answered. The patient agreed with the plan and demonstrated an understanding of the instructions.   The patient was advised  to call back or seek an in-person evaluation if the symptoms worsen or if the condition fails to improve as anticipated.  Dr Wyline Mood MD,MRCP [facility]) Gastroenterology/Hepatology Pager: [phone]   Speech recognition software was used to dictate this note.

## 2019-04-11 ENCOUNTER — Ambulatory Visit (INDEPENDENT_AMBULATORY_CARE_PROVIDER_SITE_OTHER): Payer: Medicaid Other | Admitting: Gastroenterology

## 2019-04-11 DIAGNOSIS — K219 Gastro-esophageal reflux disease without esophagitis: Secondary | ICD-10-CM | POA: Diagnosis not present

## 2019-04-11 DIAGNOSIS — K581 Irritable bowel syndrome with constipation: Secondary | ICD-10-CM

## 2019-04-11 MED ORDER — OMEPRAZOLE 40 MG PO CPDR: 40.0000 mg | DELAYED_RELEASE_CAPSULE | Freq: Every day | ORAL | 3 refills | Status: DC

## 2019-04-11 NOTE — Progress Notes (Signed)
Regina [name] , MD [address]  [address]  [place], [place] [address]  Main: [phone]  Fax: [phone]   [facility] Physician: [facility]  Virtual Visit via Video Note  I connected with patient on 04/11/19 at  9:00 AM EDT by video and verified that I am speaking with the correct person using two identifiers.   I discussed the limitations, risks, security and privacy concerns of performing an evaluation and management service by video  and the availability of in person appointments. I also discussed with the patient that there may be a patient responsible charge related to this service. The patient expressed understanding and agreed to proceed.  Location of Patient: Home Location of Provider: Home Persons involved: Patient and provider only   History of Present Illness: Chief Complaint  Patient presents with  . Follow-up    Irritable bowel syndrome with constipation    HPI: ZOIEY [name] is a 28 y.o. female   Summary of history :  She was iniially referred and seen on 10/18/18 after beingseen at the ER on 08/20/18 for abdominal pain , CT abdomen performed in the ER showed mild wall thickening of the descending colon , was discharged, returned to the ER on 08/31/18 , after a course of antibiotics. Given colace and referred to GI. Labs 09/09/18 : CBC-normal , UA normal , lipase,CMP normal , elevated glucose.    She says that she cannot have a bowel movement unless she drinks miralax or fiber, has a bowel movement once a week , hurts , does not think very hard. Feels much better after a bowel movement , has abdominal pain before a bowel movement, lower abdominal , goes to her side. Ongoing her whole life. She has seen some blood in her stool. Lost no weight , feel bloated. Mother had thyroid issues. Grand mother had colon cancer.   10/18/2018 : TSH-normal 10/23/18: colonoscopy -normal  Previously used to take [facility] powders and has stopped.    Interval history5/20/2020-04/11/2019  Doing better- miralax working . Not taking amitiza. She has a bowel movement daily, occasional pain, not often.   She is been having some heartburn daily and would like some medication.  She has had it all her life. Usually all day long . She has tried nexium- didn't work.   Current Outpatient Medications  Medication Sig Dispense Refill  . AIMOVIG 70 MG/ML SOAJ INJECT 70 MG SUBCUTANEOUSLY EVERY 28 (TWENTY-EIGHT) DAYS    . AMITIZA 24 MCG capsule TAKE 1 CAPSULE (24 MCG TOTAL) BY MOUTH 2 (TWO) TIMES DAILY WITH A MEAL FOR 30 DAYS. 60 capsule 3  . Aspirin-Salicylamide-Caffeine (BC HEADACHE POWDER PO) Take 1 packet by mouth daily as needed. For pain    . CRYSELLE-28 0.3-30 MG-MCG tablet Take 1 tablet by mouth daily.    . diphenhydrAMINE (SOMINEX) 25 MG tablet Take 25 mg by mouth at bedtime as needed for allergies or sleep.    . divalproex (DEPAKOTE) 125 MG DR tablet Take by mouth.    . Fremanezumab-vfrm 225 MG/1.5ML SOSY Inject into the skin.    Marland Kitchen ketorolac (TORADOL) 10 MG tablet TAKE 1 TABLET (10 MG) BY MOUTH EVERY 6 HOURS AS NEEDED PAIN    . nortriptyline (PAMELOR) 10 MG capsule TAKE 1 TO 2 CAPSULES BY MOUTH WITH THE 50MG  CAPS DAILY  3  . nortriptyline (PAMELOR) 50 MG capsule TAKE 1 CAPSULE BY MOUTH EVERY DAY IN THE EVENING  3  . rizatriptan (MAXALT-MLT) 10  MG disintegrating tablet TAKE 1 TAB AT HEADACHE ONSET, CAN REPEAT ONCE IN 2 HOURS IF NEEDED. NO MORE THAN 2 TABS IN 24 HOURS    . traMADol (ULTRAM) 50 MG tablet TAKE 1 2 TABLET (50 MG) BY MOUTH EVERY 6 HOURS AS NEEDED    . ciprofloxacin (CIPRO) 500 MG tablet Take 1 tablet (500 mg total) by mouth 2 (two) times daily. (Patient not taking: Reported on 10/18/2018) 14 tablet 0  . docusate sodium (COLACE) 100 MG capsule Take 1 capsule (100 mg total) by mouth daily. (Patient not taking: Reported on 10/18/2018) 10 capsule 0  . doxycycline (VIBRAMYCIN) 100 MG capsule Take 1 capsule (100 mg total) by mouth 2  (two) times daily. (Patient not taking: Reported on 01/27/2017) 28 capsule 0  . metroNIDAZOLE (FLAGYL) 500 MG tablet Take 1 tablet (500 mg total) by mouth 2 (two) times daily after a meal. (Patient not taking: Reported on 10/18/2018) 14 tablet 0  . oxyCODONE-acetaminophen (PERCOCET/ROXICET) 5-325 MG tablet Take 1 tablet by mouth every 6 (six) hours as needed for severe pain. (Patient not taking: Reported on 01/27/2017) 20 tablet 0   No current facility-administered medications for this visit.     Allergies as of 04/11/2019  . (No Known Allergies)    Review of Systems:    All systems reviewed and negative except where noted in HPI.  General Appearance:    Alert, cooperative, no distress, appears stated age  Head:    Normocephalic, without obvious abnormality, atraumatic  Eyes:    PERRL, conjunctiva/corneas clear,  Ears:    Grossly normal hearing    Neurologic:  Grossly normal    Observations/Objective:  Labs: CMP     Component Value Date/Time   NA 139 09/09/2018 2049   NA 139 07/09/2012 1838   K 3.7 09/09/2018 2049   K 3.2 (L) 07/09/2012 1838   CL 105 09/09/2018 2049   CL 106 07/09/2012 1838   CO2 26 09/09/2018 2049   CO2 28 07/09/2012 1838   GLUCOSE 119 (H) 09/09/2018 2049   GLUCOSE 91 07/09/2012 1838   BUN 10 09/09/2018 2049   BUN 9 07/09/2012 1838   CREATININE 0.64 09/09/2018 2049   CREATININE 0.76 07/09/2012 1838   CALCIUM 9.2 09/09/2018 2049   CALCIUM 8.8 07/09/2012 1838   PROT 7.8 09/09/2018 2049   PROT 8.0 07/09/2012 1838   ALBUMIN 4.2 09/09/2018 2049   ALBUMIN 4.0 07/09/2012 1838   AST 17 09/09/2018 2049   AST 17 07/09/2012 1838   ALT 18 09/09/2018 2049   ALT 14 07/09/2012 1838   ALKPHOS 60 09/09/2018 2049   ALKPHOS 70 07/09/2012 1838   BILITOT 0.1 (L) 09/09/2018 2049   BILITOT 0.3 07/09/2012 1838   GFRNONAA >60 09/09/2018 2049   GFRNONAA >60 07/09/2012 1838   GFRAA >60 09/09/2018 2049   GFRAA >60 07/09/2012 1838   Lab Results  Component Value Date    WBC 8.3 09/09/2018   HGB 12.1 09/09/2018   HCT 38.8 09/09/2018   MCV 88.4 09/09/2018   PLT 329 09/09/2018    Imaging Studies: No results found.  Assessment and Plan:   [name] is a 28 y.o. y/o female here to follow up forsevere IBS-C.Trulance caused diarrhea. Linzess , Amitiza, motegrity didn't work . Miralax sis working. She also has GERD .  Plan  1. Continue  miralax daily 1-2 times , if worsens try Trulance 2. Commence on Prilosec 40 mg for GERD. Counseled on life style changes, in 6 weeks  if doing well will decrease dose.  3. GERD patient information   F/u in 6 weeks     I discussed the assessment and treatment plan with the patient. The patient was provided an opportunity to ask questions and all were answered. The patient agreed with the plan and demonstrated an understanding of the instructions.   The patient was advised to call back or seek an in-person evaluation if the symptoms worsen or if the condition fails to improve as anticipated.    Dr Regina Mood MD,MRCP [facility]) Gastroenterology/Hepatology Pager: [phone]   Speech recognition software was used to dictate this note.

## 2019-05-22 ENCOUNTER — Ambulatory Visit: Payer: Medicaid Other | Admitting: Gastroenterology

## 2019-05-22 ENCOUNTER — Encounter: Payer: Self-pay | Admitting: Gastroenterology

## 2019-05-22 ENCOUNTER — Telehealth: Payer: Self-pay | Admitting: Gastroenterology

## 2019-05-22 ENCOUNTER — Other Ambulatory Visit: Payer: Self-pay

## 2019-05-22 VITALS — BP 114/82 | HR 114 | Temp 98.2°F | Ht 69.0 in | Wt 208.6 lb

## 2019-05-22 DIAGNOSIS — K581 Irritable bowel syndrome with constipation: Secondary | ICD-10-CM | POA: Diagnosis not present

## 2019-05-22 MED ORDER — POLYETHYLENE GLYCOL 3350 17 G PO PACK: 17.0000 g | PACK | Freq: Every day | ORAL | 3 refills | Status: AC

## 2019-05-22 NOTE — Progress Notes (Signed)
Jonathon Bellows MD, MRCP(U.K) [address]  [place]  [place], [place] [address]  Main: [phone]  Fax: [phone]   Primary Care Physician: [facility]  Primary Gastroenterologist:  Dr. Jonathon Bellows   Chief Complaint  Patient presents with  . Follow-up    IBS with constipation, hemorrhoids    HPI: Regina Regina [name] is a 28 y.o. female   Summary of history :  She was iniially referred and seen on 10/18/18 after beingseen at the ER on 08/20/18 for abdominal pain , CT abdomen performed in the ER showed mild wall thickening of the descending colon , was discharged, returned to the ER on 08/31/18 , after a course of antibiotics. Given colace and referred to GI. Labs 09/09/18 : CBC-normal , UA normal , lipase,CMP normal , elevated glucose.    She says that she cannot have a bowel movement unless she drinks miralax or fiber, has a bowel movement once a week , hurts , does not think very hard. Feels much better after a bowel movement , has abdominal pain before a bowel movement, lower abdominal , goes to her side. Ongoing her whole life. She has seen some blood in her stool. Lost no weight , feel bloated. Regina [name]. Regina Regina [name] had colon cancer.   10/18/2018 : TSH-normal 10/23/18: colonoscopy -normal  Previously used to take [facility] powders and has stopped.   Interval history6/01/2019-05/22/2019  He is doing well the MiraLAX has been helping her with her constipation she has tried other agents in the past including Amitiza/Linzess/Motegrity which have not worked.  Her main issue with itching and burning of her hemorrhoids.   Current Outpatient Medications  Medication Sig Dispense Refill  . AIMOVIG 70 MG/ML SOAJ INJECT 70 MG SUBCUTANEOUSLY EVERY 28 (TWENTY-EIGHT) DAYS    . AMITIZA 24 MCG capsule TAKE 1 CAPSULE (24 MCG TOTAL) BY MOUTH 2 (TWO) TIMES DAILY WITH A MEAL FOR 30 DAYS. (Patient not taking: Reported on 05/22/2019) 60 capsule 3  .  Aspirin-Salicylamide-Caffeine (BC HEADACHE POWDER PO) Take 1 packet by mouth daily as needed. For pain    . ciprofloxacin (CIPRO) 500 MG tablet Take 1 tablet (500 mg total) by mouth 2 (two) times daily. (Patient not taking: Reported on 10/18/2018) 14 tablet 0  . CRYSELLE-28 0.3-30 MG-MCG tablet Take 1 tablet by mouth daily.    . diphenhydrAMINE (SOMINEX) 25 MG tablet Take 25 mg by mouth at bedtime as needed for allergies or sleep.    . divalproex (DEPAKOTE) 125 MG DR tablet Take by mouth.    . docusate sodium (COLACE) 100 MG capsule Take 1 capsule (100 mg total) by mouth daily. (Patient not taking: Reported on 10/18/2018) 10 capsule 0  . doxycycline (VIBRAMYCIN) 100 MG capsule Take 1 capsule (100 mg total) by mouth 2 (two) times daily. (Patient not taking: Reported on 01/27/2017) 28 capsule 0  . Fremanezumab-vfrm 225 MG/1.5ML SOSY Inject into the skin.    Marland Kitchen ketorolac (TORADOL) 10 MG tablet TAKE 1 TABLET (10 MG) BY MOUTH EVERY 6 HOURS AS NEEDED PAIN    . metroNIDAZOLE (FLAGYL) 500 MG tablet Take 1 tablet (500 mg total) by mouth 2 (two) times daily after a meal. (Patient not taking: Reported on 10/18/2018) 14 tablet 0  . nortriptyline (PAMELOR) 10 MG capsule TAKE 1 TO 2 CAPSULES BY MOUTH WITH THE 50MG  CAPS DAILY  3  . nortriptyline (PAMELOR) 50 MG capsule TAKE 1 CAPSULE BY MOUTH EVERY DAY IN THE EVENING  3  .  omeprazole (PRILOSEC) 40 MG capsule Take 1 capsule (40 mg total) by mouth daily. (Patient not taking: Reported on 05/22/2019) 90 capsule 3  . oxyCODONE-acetaminophen (PERCOCET/ROXICET) 5-325 MG tablet Take 1 tablet by mouth every 6 (six) hours as needed for severe pain. (Patient not taking: Reported on 01/27/2017) 20 tablet 0  . rizatriptan (MAXALT-MLT) 10 MG disintegrating tablet TAKE 1 TAB AT HEADACHE ONSET, CAN REPEAT ONCE IN 2 HOURS IF NEEDED. NO MORE THAN 2 TABS IN 24 HOURS    . traMADol (ULTRAM) 50 MG tablet TAKE 1 2 TABLET (50 MG) BY MOUTH EVERY 6 HOURS AS NEEDED     No current  facility-administered medications for this visit.     Allergies as of 05/22/2019  . (No Known Allergies)    ROS:  General: Negative for anorexia, weight loss, fever, chills, fatigue, weakness. ENT: Negative for hoarseness, difficulty swallowing , nasal congestion. CV: Negative for chest pain, angina, palpitations, dyspnea on exertion, peripheral edema.  Respiratory: Negative for dyspnea at rest, dyspnea on exertion, cough, sputum, wheezing.  GI: See history of present illness. GU:  Negative for dysuria, hematuria, urinary incontinence, urinary frequency, nocturnal urination.  Endo: Negative for unusual weight change.    Physical Examination:   BP 114/82   Pulse (!) 114   Temp 98.2 F (36.8 C)   Ht 5\' 9"  (1.753 m)   Wt 208 lb 9.6 oz (94.6 kg)   BMI 30.80 kg/m   General: Well-nourished, well-developed in no acute distress.  Eyes: No icterus. Conjunctivae pink. Mouth: Oropharyngeal mucosa moist and pink , no lesions erythema or exudate. Lungs: Clear to auscultation bilaterally. Non-labored. Heart: Regular rate and rhythm, no murmurs rubs or gallops.  Abdomen: Bowel sounds are normal, nontender, nondistended, no hepatosplenomegaly or masses, no abdominal bruits or hernia , no rebound or guarding.   Extremities: No lower extremity edema. No clubbing or deformities. Neuro: Alert and oriented x 3.  Grossly intact. Skin: Warm and dry, no jaundice.   Psych: Alert and cooperative, normal mood and affect.   Imaging Studies: No results found.  Assessment and Plan:   Regina Regina [name] is a 28 y.o. y/o female here to follow up forsevere IBS-C.Trulance caused diarrhea. Linzess,Amitiza, motegrity didn't work . Miralax sis working. She also has GERD.  Plan  1. Continue  miralax daily 1-2 times , if worsens try Trulance 2.   Discussed conservative management of hemorrhoids including stool softeners perianal hygiene.  If it continues to bother her after a few weeks she has been  asked to call my office and schedule banding with 1 of [name]. Follow-up as needed   Dr Wyline MoodKiran Damaria Vachon  MD,MRCP [facility](U.K)

## 2019-05-22 NOTE — Telephone Encounter (Signed)
Patient called & states she gave the wrong medication name that medicaid would pay for. She states Glico(she did not know how to spell) Medicaid will pay for this & not the name brand. Patient uses [facility] in  [place].

## 2019-05-23 NOTE — Telephone Encounter (Signed)
Called pt to get clarification on the name of the medication she's requesting.  Unable to contact, LVM to return call

## 2019-05-23 NOTE — Telephone Encounter (Signed)
Spoke with pt regarding the generic Miralax prescription pt states the pharmacy informed her that Medicaid no longer covers this medication it has to be purchased over the counter. Pt went on to state that her hemorrhoids have became more itchy and are now becoming slightly painful. Pt states the Preparation H cream only gives temporary relief. Pt is requesting a prescription for a stronger topical medication. I explained that I will relay this information to Dr. Vicente Males and then advise.

## 2019-05-24 NOTE — Telephone Encounter (Signed)
There is nothing stronger- if she wishes banding can have her see Dr Marius Ditch - suggest sits bath

## 2019-05-24 NOTE — Telephone Encounter (Signed)
Called pt to inform her of Dr. Anna's recommendations.  Unable to contact, LVM to return call 

## 2019-07-02 ENCOUNTER — Other Ambulatory Visit: Payer: Self-pay

## 2019-07-03 ENCOUNTER — Ambulatory Visit: Payer: Medicaid Other | Admitting: Gastroenterology

## 2019-07-03 ENCOUNTER — Encounter: Payer: Self-pay | Admitting: Gastroenterology

## 2019-07-03 ENCOUNTER — Other Ambulatory Visit: Payer: Self-pay

## 2019-07-03 ENCOUNTER — Encounter (INDEPENDENT_AMBULATORY_CARE_PROVIDER_SITE_OTHER): Payer: Self-pay

## 2019-07-03 VITALS — BP 114/74 | HR 66 | Temp 98.4°F | Ht 69.0 in | Wt 194.4 lb

## 2019-07-03 DIAGNOSIS — K641 Second degree hemorrhoids: Secondary | ICD-10-CM

## 2019-07-03 NOTE — Progress Notes (Signed)
PROCEDURE NOTE: The patient presents with symptomatic grade 2 hemorrhoids, unresponsive to maximal medical therapy, requesting rubber band ligation of his/her hemorrhoidal disease.  All risks, benefits and alternative forms of therapy were described and informed consent was obtained.  In the Left Lateral Decubitus position (if anoscopy is performed) anoscopic examination revealed grade 2 hemorrhoids in the all position(s).   The decision was made to band the RA internal hemorrhoid, and the [place] was used to perform band ligation without complication.  Digital anorectal examination was then performed to assure proper positioning of the band, and to adjust the banded tissue as required.  The patient was discharged home without pain or other issues.  Dietary and behavioral recommendations were given and (if necessary - prescriptions were given), along with follow-up instructions.  The patient will return 3 weeks for follow-up and possible additional banding as required.  Patient developed presyncope after placement of the band due to pain, therefore I loosened the band and her pain was relieved.  She had nausea, diaphoresis and lightheadedness which relieved before patient was discharged from clinic.  Patient came to office on empty stomach.  Advised her to have plenty of fluids and eat before banding for next visit  [name], MD [address]  [place]  [place], [place] [address]  Main: [phone]  Fax: [phone] Pager: [phone]

## 2019-07-03 NOTE — Progress Notes (Signed)
[name], MD [address]  [place]  [place], [place] [address]  Main: [phone]  Fax: [phone] Pager: [phone]   Primary Care Physician: [facility]  Primary Gastroenterologist:  Dr. Jonathon Bellows  Chief Complaint  Patient presents with  . Hemorrhoid Banding #1    HPI: Regina [name] is a 28 y.o. female with longstanding history of IBS-C and symptomatic external hemorrhoids.  She underwent colonoscopy 2019 which was unremarkable.  She has been managed for her IBS-C symptoms by Dr. Vicente Males.  Currently, her constipation is regulated with MiraLAX twice daily.  Her hemorrhoidal symptoms include severe perianal itching, burning, soiling/leakage, pressure and discomfort, rectal pain, prolapse and occasional blood on wiping.  She spends more than 60 minutes on toilet every day during a BM as she feels incompletely emptied and has severe abdominal cramps.  She denies hard stools.  She is here to discuss about hemorrhoid ligation.  Current Outpatient Medications  Medication Sig Dispense Refill  . AIMOVIG 70 MG/ML SOAJ INJECT 70 MG SUBCUTANEOUSLY EVERY 28 (TWENTY-EIGHT) DAYS    . CRYSELLE-28 0.3-30 MG-MCG tablet Take 1 tablet by mouth daily.    . diphenhydrAMINE (SOMINEX) 25 MG tablet Take 25 mg by mouth at bedtime as needed for allergies or sleep.    . divalproex (DEPAKOTE) 125 MG DR tablet Take by mouth.    . docusate sodium (COLACE) 100 MG capsule Take 1 capsule (100 mg total) by mouth daily. 10 capsule 0  . Fremanezumab-vfrm 225 MG/1.5ML SOSY Inject into the skin.    Marland Kitchen ketorolac (TORADOL) 10 MG tablet TAKE 1 TABLET (10 MG) BY MOUTH EVERY 6 HOURS AS NEEDED PAIN    . mupirocin ointment (BACTROBAN) 2 % APPLY TO AFFECTED AREA 3 TIMES A DAY FOR 10 DAYS    . nortriptyline (PAMELOR) 50 MG capsule TAKE 1 CAPSULE BY MOUTH EVERY DAY IN THE EVENING  3  . omeprazole (PRILOSEC) 40 MG capsule Take 1 capsule (40 mg total) by mouth daily. 90 capsule 3  .  oxyCODONE-acetaminophen (PERCOCET/ROXICET) 5-325 MG tablet Take 1 tablet by mouth every 6 (six) hours as needed for severe pain. 20 tablet 0  . polyethylene glycol (MIRALAX) 17 g packet Take 17 g by mouth daily. 30 packet 3  . rizatriptan (MAXALT-MLT) 10 MG disintegrating tablet TAKE 1 TAB AT HEADACHE ONSET, CAN REPEAT ONCE IN 2 HOURS IF NEEDED. NO MORE THAN 2 TABS IN 24 HOURS    . traMADol (ULTRAM) 50 MG tablet TAKE 1 2 TABLET (50 MG) BY MOUTH EVERY 6 HOURS AS NEEDED     No current facility-administered medications for this visit.     Allergies as of 07/03/2019  . (No Known Allergies)    NSAIDs: None  Antiplts/Anticoagulants/Anti thrombotics: None  GI procedures: Colonoscopy 10/2018 by Dr. Vicente Males - The entire examined colon is normal on direct and retroflexion views. - No specimens collected.  ROS:  General: Negative for anorexia, weight loss, fever, chills, fatigue, weakness. ENT: Negative for hoarseness, difficulty swallowing , nasal congestion. CV: Negative for chest pain, angina, palpitations, dyspnea on exertion, peripheral edema.  Respiratory: Negative for dyspnea at rest, dyspnea on exertion, cough, sputum, wheezing.  GI: See history of present illness. GU:  Negative for dysuria, hematuria, urinary incontinence, urinary frequency, nocturnal urination.  Endo: Negative for unusual weight change.    Physical Examination:   BP 114/74 (BP Location: Right Arm, Patient Position: Sitting)   Pulse 66   Temp 98.4 F (36.9 C) (Oral)  Ht 5\' 9"  (1.753 m)   Wt 194 lb 6.4 oz (88.2 kg)   BMI 28.71 kg/m   General: Well-nourished, well-developed in no acute distress.  Eyes: No icterus. Conjunctivae pink. Mouth: Oropharyngeal mucosa moist and pink , no lesions erythema or exudate. Lungs: Clear to auscultation bilaterally. Non-labored. Heart: Regular rate and rhythm, no murmurs rubs or gallops.  Abdomen: Bowel sounds are normal, nontender, nondistended, no hepatosplenomegaly or  masses, no hernia , no rebound or guarding.   Rectum: Sharp tenderness in the posterior wall of anal canal Extremities: No lower extremity edema. No clubbing or deformities. Neuro: Alert and oriented x 3.  Grossly intact. Skin: Warm and dry, no jaundice.   Psych: Alert and cooperative, normal mood and affect.   Imaging Studies: Reviewed  Assessment and Plan:   Regina [name] is a 28 y.o. female with IBS-C, grade 2 symptomatic external hemorrhoids referred for hemorrhoid ligation  Symptomatic hemorrhoids: Discussed in length with patient about management of hemorrhoids Reiterated not to spend more than 5 to 10 minutes on toilet Continue MiraLAX to keep bowels regular Discussed with her about hemorrhoid ligation including technique, risks and benefits, consent obtained Perform hemorrhoid ligation today  Anal fissure Recommend topical 0.125% nitroglycerin plus lidocaine, instructions provided  Follow up in 3 weeks   Dr Lannette Donathohini Ival Basquez, MD

## 2019-08-13 ENCOUNTER — Other Ambulatory Visit: Payer: Self-pay

## 2019-08-13 ENCOUNTER — Encounter: Payer: Self-pay | Admitting: Gastroenterology

## 2019-08-13 ENCOUNTER — Ambulatory Visit: Payer: Medicaid Other | Admitting: Gastroenterology

## 2019-08-13 VITALS — BP 114/78 | HR 81 | Temp 98.7°F | Wt 190.0 lb

## 2019-08-13 DIAGNOSIS — K641 Second degree hemorrhoids: Secondary | ICD-10-CM | POA: Diagnosis not present

## 2019-08-13 NOTE — Progress Notes (Signed)
PROCEDURE NOTE: The patient presents with symptomatic grade 2 hemorrhoids, unresponsive to maximal medical therapy, requesting rubber band ligation of his/her hemorrhoidal disease.  All risks, benefits and alternative forms of therapy were described and informed consent was obtained.  The decision was made to band the RP internal hemorrhoid, and the [place] was used to perform band ligation without complication.  Digital anorectal examination was then performed to assure proper positioning of the band, and to adjust the banded tissue as required.  The patient was discharged home without pain or other issues.  Dietary and behavioral recommendations were given and (if necessary - prescriptions were given), along with follow-up instructions.  The patient will return 2 weeks for follow-up and possible additional banding as required.    [name], MD [address]  [place]  [place], [place] [address]  Main: [phone]  Fax: [phone] Pager: [phone]

## 2019-08-29 ENCOUNTER — Other Ambulatory Visit: Payer: Self-pay

## 2019-08-29 ENCOUNTER — Encounter: Payer: Self-pay | Admitting: Gastroenterology

## 2019-08-29 ENCOUNTER — Ambulatory Visit: Payer: Medicaid Other | Admitting: Gastroenterology

## 2019-08-29 VITALS — BP 108/76 | HR 94 | Temp 98.7°F | Resp 17 | Ht 69.0 in | Wt 188.2 lb

## 2019-08-29 DIAGNOSIS — K641 Second degree hemorrhoids: Secondary | ICD-10-CM | POA: Diagnosis not present

## 2019-08-29 NOTE — Progress Notes (Signed)
PROCEDURE NOTE: The patient presents with symptomatic grade 2 hemorrhoids, unresponsive to maximal medical therapy, requesting rubber band ligation of his/her hemorrhoidal disease.  All risks, benefits and alternative forms of therapy were described and informed consent was obtained.  The decision was made to band the LL internal hemorrhoid, and the [place] was used to perform band ligation without complication.  Digital anorectal examination was then performed to assure proper positioning of the band, and to adjust the banded tissue as required.  The patient was discharged home without pain or other issues.  Dietary and behavioral recommendations were given and (if necessary - prescriptions were given), along with follow-up instructions.  The patient will return as needed for follow-up and possible additional banding as required.  Advised her to follow-up with Dr. Vicente Males for IBS-C and see me for hemorrhoidal symptoms as needed  [name], MD [address]  [place]  [place], [place] [address]  Main: [phone]  Fax: [phone] Pager: [phone]

## 2019-09-24 ENCOUNTER — Ambulatory Visit: Payer: Medicaid Other | Admitting: Gastroenterology

## 2019-10-02 ENCOUNTER — Encounter: Payer: Self-pay | Admitting: *Deleted

## 2019-10-02 ENCOUNTER — Other Ambulatory Visit: Payer: Self-pay

## 2019-10-02 NOTE — Discharge Instructions (Signed)
T & A INSTRUCTION SHEET - [facility] °[facility] ° °P. SCOTT BENNETT, MD ° °[address] [place], [place] [address] TEL. [phone] °[address] [place] [address] [phone] ° °INFORMATION SHEET FOR A TONSILLECTOMY AND ADENDOIDECTOMY ° °About Your Tonsils and Adenoids °The tonsils and adenoids are normal body tissues that are part of our immune system. They normally help to protect us against diseases that may enter our mouth and nose. However, sometimes the tonsils and/or adenoids become too large and obstruct our breathing, especially at night. ° °If either of these things happen it helps to remove the tonsils and adenoids in order to become healthier. The operation to remove the tonsils and adenoids is called a tonsillectomy and adenoidectomy. ° °The Location of Your Tonsils and Adenoids °The tonsils are located in the back of the throat on both side and sit in a cradle of muscles. The adenoids are located in the roof of the mouth, behind the nose, and closely associated with the opening of the Eustachian tube to the ear. ° °Surgery on Tonsils and Adenoids °A tonsillectomy and adenoidectomy is a short operation which takes about thirty minutes. This includes being put to sleep and being awakened. Tonsillectomies and adenoidectomies are performed at [facility] and may require observation period in the recovery room prior to going home. Children are required to remain in the recovery area for 45 minutes after surgery. ° °Following the Operation for a Tonsillectomy °A cautery machine is used to control bleeding.  Bleeding from a tonsillectomy and adenoidectomy is minimal and postoperatively the risk of bleeding is approximately four percent, although this rarely life threatening. ° °After your tonsillectomy and adenoidectomy post-op care at home: °1. Our patients are able to go home the same day. You may be given prescriptions for  pain medications and antibiotics, if indicated. °2. It is extremely important to remember that fluid intake is of utmost importance after a tonsillectomy. The amount that you drink must be maintained in the postoperative period. A good indication of whether a child is getting enough fluid is whether his/her urine output is constant.  As long as children are urinating or wetting their diaper every 6 - 8 hours this is usually enough fluid intake.   °3. Although rare, this is a risk of some bleeding in the first ten days after surgery. This usually occurs between day five and nine postoperatively. This risk of bleeding is approximately four percent.  If you or your child should have any bleeding you should remain calm and notify our office or go directly to the Emergency Room at [facility] where they will contact us. Our doctors are available seven days a week for notification. We recommend sitting up quietly in a chair, place an ice pack on the front of the neck and spitting out the blood gently until we are able to contact you. Adults should gargle gently with ice water and this may help stop the bleeding. If the bleeding does not stop after a short time, i.e. 10 to 15 minutes, or seems to be increasing again, please contact us or go to the hospital.   °4. It is common for the pain to be worse at 5 - 7 days postoperatively. This occurs because the “scab” is peeling off and the mucous membrane (skin of the throat) is growing back where the tonsils were.   °5. It is common for a low-grade fever,   less than 102, during the first week after a tonsillectomy and adenoidectomy. It is usually due to not drinking enough liquids, and we suggest your use liquid Tylenol (acetaminophen) or the pain medicine with Tylenol (acetaminophen) prescribed in order to keep your temperature below 102. Please follow the directions on the back of the bottle. °6. Do not take aspirin or any products that contain aspirin  such as Bufferin, Anacin, Ecotrin, aspirin gum, Goodies, BC headache powders, etc., after a T&A because it can promote bleeding.  DO NOT TAKE MOTRIN OR IBUPROFEN. Please check with our office before administering any other medication that may been prescribed by other doctors during the two-week post-operative period. °7. If you happen to look in the mirror or into your child's mouth you will see white/gray patches on the back of the throat.  This is what a scab looks like in the mouth and is normal after having a tonsillectomy and adenoidectomy. It will disappear once the tonsil area heals completely. However, it may cause a noticeable odor, and this too will disappear with time.     °8. You or your child may experience ear pain after having a tonsillectomy and adenoidectomy. This is called referred pain and comes from the throat, but it is felt in the ears. Ear pain is quite common and expected. It will usually go away after ten days. There is usually nothing wrong with the ears, and it is primarily due to the healing area stimulating the nerve to the ear that runs along the side of the throat. Use either the prescribed pain medicine or Tylenol (acetaminophen) as needed.  °9. The throat tissues after a tonsillectomy are obviously sensitive. Smoking around children who have had a tonsillectomy significantly increases the risk of bleeding.  DO NOT SMOKE! °What to Expect Each Day  °First Day at Home °1. Patients will be discharged home the same day.  °2. Drink at least four glasses of liquid a day. Clear, cool liquids are recommended. Fruit juices containing citric acid are not recommended because they tend to cause pain. Carbonated beverages are allowed if you pour them from glass to glass to remove the bubbles as these tend to cause discomfort. Avoid alcoholic beverages.  °3. Eat very soft foods such as soups, broth, jello, custard, pudding, ice cream, popsicles, applesauce, mashed potatoes, and in general anything  that you can crush between your tongue and the roof of your mouth. Try adding Carnation Instant Breakfast Mix into your food for extra calories. It is not uncommon to lose 5 to 10 pounds of fluid weight. The weight will be gained back quickly once you're feeling better and drinking more.  °4. Sleep with your head elevated on two pillows for about three days to help decrease the swelling.  °5. DO NOT SMOKE!  °Day Two  °1. Rest as much as possible. Use common sense in your activities.  °2. Continue drinking at least four glasses of liquid per day.  °3. Follow the soft diet.  °4. Use your pain medication as needed.  °Day Three  °1. Advance your activity as you are able and continue to follow the previous day's suggestions.  °Days Four Through Six  °1. Advance your diet and begin to eat more solid foods such as chopped hamburger. °2. Advance your activities slowly. Children should be kept mostly around the house.  °3. Not uncommonly, there will be more pain at this time. It is temporary, usually lasting a day or two.  °Day Seven   Through Ten  °1. Most individuals by this time are able to return to work or school unless otherwise instructed. Consider sending children back to school for a half day on the first day back. ° ° °General Anesthesia, Adult, Care After °This sheet gives you information about how to care for yourself after your procedure. Your health care provider may also give you more specific instructions. If you have problems or questions, contact your health care provider. °What can I expect after the procedure? °After the procedure, the following side effects are common: °· Pain or discomfort at the IV site. °· Nausea. °· Vomiting. °· Sore throat. °· Trouble concentrating. °· Feeling cold or chills. °· Weak or tired. °· Sleepiness and fatigue. °· Soreness and body aches. These side effects can affect parts of the body that were not involved in surgery. °Follow these instructions at home: ° °For at least 24  hours after the procedure: °· Have a responsible adult stay with you. It is important to have someone help care for you until you are awake and alert. °· Rest as needed. °· Do not: °? Participate in activities in which you could fall or become injured. °? Drive. °? Use heavy machinery. °? Drink alcohol. °? Take sleeping pills or medicines that cause drowsiness. °? Make important decisions or sign legal documents. °? Take care of children on your own. °Eating and drinking °· Follow any instructions from your health care provider about eating or drinking restrictions. °· When you feel hungry, start by eating small amounts of foods that are soft and easy to digest (bland), such as toast. Gradually return to your regular diet. °· Drink enough fluid to keep your urine pale yellow. °· If you vomit, rehydrate by drinking water, juice, or clear broth. °General instructions °· If you have sleep apnea, surgery and certain medicines can increase your risk for breathing problems. Follow instructions from your health care provider about wearing your sleep device: °? Anytime you are sleeping, including during daytime naps. °? While taking prescription pain medicines, sleeping medicines, or medicines that make you drowsy. °· Return to your normal activities as told by your health care provider. Ask your health care provider what activities are safe for you. °· Take over-the-counter and prescription medicines only as told by your health care provider. °· If you smoke, do not smoke without supervision. °· Keep all follow-up visits as told by your health care provider. This is important. °Contact a health care provider if: °· You have nausea or vomiting that does not get better with medicine. °· You cannot eat or drink without vomiting. °· You have pain that does not get better with medicine. °· You are unable to pass urine. °· You develop a skin rash. °· You have a fever. °· You have redness around your IV site that gets worse. °Get  help right away if: °· You have difficulty breathing. °· You have chest pain. °· You have blood in your urine or stool, or you vomit blood. °Summary °· After the procedure, it is common to have a sore throat or nausea. It is also common to feel tired. °· Have a responsible adult stay with you for the first 24 hours after general anesthesia. It is important to have someone help care for you until you are awake and alert. °· When you feel hungry, start by eating small amounts of foods that are soft and easy to digest (bland), such as toast. Gradually return to your regular diet. °·   Drink enough fluid to keep your urine pale yellow. °· Return to your normal activities as told by your health care provider. Ask your health care provider what activities are safe for you. °This information is not intended to replace advice given to you by your health care provider. Make sure you discuss any questions you have with your health care provider. °Document Released: 01/31/2001 Document Revised: 10/28/2017 Document Reviewed: 06/10/2017 °Elsevier Patient Education © 2020 [employer] ° °

## 2019-10-05 ENCOUNTER — Other Ambulatory Visit: Payer: Self-pay

## 2019-10-05 ENCOUNTER — Other Ambulatory Visit
Admission: RE | Admit: 2019-10-05 | Discharge: 2019-10-05 | Disposition: A | Discharge status: Home / Self Care | Payer: Medicaid Other | Source: Ambulatory Visit | Attending: Otolaryngology | Admitting: Otolaryngology

## 2019-10-05 DIAGNOSIS — Z20828 Contact with and (suspected) exposure to other viral communicable diseases: Secondary | ICD-10-CM | POA: Insufficient documentation

## 2019-10-05 DIAGNOSIS — Z01812 Encounter for preprocedural laboratory examination: Secondary | ICD-10-CM | POA: Insufficient documentation

## 2019-10-05 LAB — SARS CORONAVIRUS 2 (TAT 6-24 HRS): SARS Coronavirus 2: NEGATIVE

## 2019-10-08 NOTE — Anesthesia Preprocedure Evaluation (Addendum)
Anesthesia Evaluation  Patient identified by MRN, date of birth, ID band Patient awake    Reviewed: Allergy & Precautions, NPO status , Patient's Chart, lab work & pertinent test results  History of Anesthesia Complications (+) PONV and history of anesthetic complications  Airway Mallampati: II  TM Distance: >3 FB Neck ROM: Full    Dental   Pulmonary    breath sounds clear to auscultation       Cardiovascular (-) angina(-) DOE  Rhythm:Regular Rate:Normal     Neuro/Psych  Headaches, PSYCHIATRIC DISORDERS (ADHD, post-concussion syndrome)    GI/Hepatic GERD  Controlled,  Endo/Other    Renal/GU      Musculoskeletal  (+) Arthritis ,   Abdominal   Peds  Hematology  (+) Blood dyscrasia, anemia ,   Anesthesia Other Findings   Reproductive/Obstetrics                            Anesthesia Physical Anesthesia Plan  ASA: II  Anesthesia Plan: General   Post-op Pain Management:    Induction: Intravenous  PONV Risk Score and Plan: 4 or greater and Ondansetron, Dexamethasone, Treatment may vary due to age or medical condition, Scopolamine patch - Pre-op and Midazolam  Airway Management Planned: Oral ETT  Additional Equipment:   Intra-op Plan:   Post-operative Plan: Extubation in OR  Informed Consent: I have reviewed the patients History and Physical, chart, labs and discussed the procedure including the risks, benefits and alternatives for the proposed anesthesia with the patient or authorized representative who has indicated his/her understanding and acceptance.       Plan Discussed with: CRNA and Anesthesiologist  Anesthesia Plan Comments:         Anesthesia Quick Evaluation

## 2019-10-09 ENCOUNTER — Encounter
Admission: RE | Disposition: A | Discharge status: Home / Self Care | Payer: Self-pay | Source: Home / Self Care | Attending: Otolaryngology

## 2019-10-09 ENCOUNTER — Ambulatory Visit: Payer: Medicaid Other | Admitting: Anesthesiology

## 2019-10-09 ENCOUNTER — Ambulatory Visit
Admission: RE | Admit: 2019-10-09 | Discharge: 2019-10-09 | Disposition: A | Discharge status: Home / Self Care | Payer: Medicaid Other | Attending: Otolaryngology | Admitting: Otolaryngology

## 2019-10-09 ENCOUNTER — Other Ambulatory Visit: Payer: Self-pay

## 2019-10-09 DIAGNOSIS — Z79899 Other long term (current) drug therapy: Secondary | ICD-10-CM | POA: Diagnosis not present

## 2019-10-09 DIAGNOSIS — K219 Gastro-esophageal reflux disease without esophagitis: Secondary | ICD-10-CM | POA: Diagnosis not present

## 2019-10-09 DIAGNOSIS — J301 Allergic rhinitis due to pollen: Secondary | ICD-10-CM | POA: Diagnosis not present

## 2019-10-09 DIAGNOSIS — R599 Enlarged lymph nodes, unspecified: Secondary | ICD-10-CM | POA: Insufficient documentation

## 2019-10-09 DIAGNOSIS — J3501 Chronic tonsillitis: Secondary | ICD-10-CM | POA: Diagnosis not present

## 2019-10-09 DIAGNOSIS — F17290 Nicotine dependence, other tobacco product, uncomplicated: Secondary | ICD-10-CM | POA: Insufficient documentation

## 2019-10-09 HISTORY — DX: Nausea with vomiting, unspecified: R11.2

## 2019-10-09 HISTORY — DX: Gastro-esophageal reflux disease without esophagitis: K21.9

## 2019-10-09 HISTORY — DX: Migraine, unspecified, not intractable, without status migrainosus: G43.909

## 2019-10-09 HISTORY — PX: TONSILLECTOMY: SHX5217

## 2019-10-09 HISTORY — DX: Anemia, unspecified: D64.9

## 2019-10-09 HISTORY — DX: Other specified postprocedural states: Z98.890

## 2019-10-09 LAB — POCT PREGNANCY, URINE: Preg Test, Ur: NEGATIVE

## 2019-10-09 SURGERY — TONSILLECTOMY
Anesthesia: General | Laterality: Bilateral

## 2019-10-09 MED ORDER — MEPERIDINE HCL 25 MG/ML IJ SOLN: 6.2500 mg | INTRAMUSCULAR | Status: DC | PRN

## 2019-10-09 MED ORDER — HYDROCODONE-ACETAMINOPHEN 7.5-325 MG/15ML PO SOLN: ORAL | 0 refills | Status: DC

## 2019-10-09 MED ORDER — MIDAZOLAM HCL 5 MG/5ML IJ SOLN
INTRAMUSCULAR | Status: DC | PRN
  Administered 2019-10-09: 2 mg via INTRAVENOUS

## 2019-10-09 MED ORDER — PROPOFOL 10 MG/ML IV BOLUS
INTRAVENOUS | Status: DC | PRN
  Administered 2019-10-09: 50 mg via INTRAVENOUS
  Administered 2019-10-09: 200 mg via INTRAVENOUS

## 2019-10-09 MED ORDER — ONDANSETRON HCL 4 MG/2ML IJ SOLN
INTRAMUSCULAR | Status: DC | PRN
  Administered 2019-10-09: 4 mg via INTRAVENOUS

## 2019-10-09 MED ORDER — SUCCINYLCHOLINE CHLORIDE 20 MG/ML IJ SOLN
INTRAMUSCULAR | Status: DC | PRN
  Administered 2019-10-09: 100 mg via INTRAVENOUS

## 2019-10-09 MED ORDER — BUPIVACAINE HCL (PF) 0.25 % IJ SOLN
INTRAMUSCULAR | Status: DC | PRN
  Administered 2019-10-09: 4 mL

## 2019-10-09 MED ORDER — OXYCODONE HCL 5 MG/5ML PO SOLN
5.0000 mg | Freq: Once | ORAL | Status: AC | PRN
  Administered 2019-10-09: 5 mg via ORAL

## 2019-10-09 MED ORDER — OXYCODONE HCL 5 MG PO TABS: 5.0000 mg | ORAL_TABLET | Freq: Once | ORAL | Status: AC | PRN

## 2019-10-09 MED ORDER — GLYCOPYRROLATE 0.2 MG/ML IJ SOLN
INTRAMUSCULAR | Status: DC | PRN
  Administered 2019-10-09: 0.1 mg via INTRAVENOUS

## 2019-10-09 MED ORDER — SCOPOLAMINE 1 MG/3DAYS TD PT72
1.0000 | MEDICATED_PATCH | TRANSDERMAL | Status: DC
  Administered 2019-10-09: 1.5 mg via TRANSDERMAL

## 2019-10-09 MED ORDER — PREDNISOLONE SODIUM PHOSPHATE 15 MG/5ML PO SOLN: ORAL | 0 refills | Status: DC

## 2019-10-09 MED ORDER — DEXAMETHASONE SODIUM PHOSPHATE 4 MG/ML IJ SOLN
INTRAMUSCULAR | Status: DC | PRN
  Administered 2019-10-09: 10 mg via INTRAVENOUS

## 2019-10-09 MED ORDER — HYDROMORPHONE HCL 1 MG/ML IJ SOLN
0.2500 mg | INTRAMUSCULAR | Status: DC | PRN
  Administered 2019-10-09: 0.3 mg via INTRAVENOUS
  Administered 2019-10-09: 0.5 mg via INTRAVENOUS
  Administered 2019-10-09: 0.3 mg via INTRAVENOUS

## 2019-10-09 MED ORDER — LIDOCAINE HCL (CARDIAC) PF 100 MG/5ML IV SOSY
PREFILLED_SYRINGE | INTRAVENOUS | Status: DC | PRN
  Administered 2019-10-09: 50 mg via INTRAVENOUS

## 2019-10-09 MED ORDER — FENTANYL CITRATE (PF) 100 MCG/2ML IJ SOLN
INTRAMUSCULAR | Status: DC | PRN
  Administered 2019-10-09: 100 ug via INTRAVENOUS
  Administered 2019-10-09: 25 ug via INTRAVENOUS

## 2019-10-09 MED ORDER — LACTATED RINGERS IV SOLN
100.0000 mL/h | INTRAVENOUS | Status: DC
  Administered 2019-10-09: 100 mL/h via INTRAVENOUS

## 2019-10-09 MED ORDER — ACETAMINOPHEN 10 MG/ML IV SOLN
1000.0000 mg | Freq: Once | INTRAVENOUS | Status: AC
  Administered 2019-10-09: 1000 mg via INTRAVENOUS

## 2019-10-09 MED ORDER — PROMETHAZINE HCL 25 MG/ML IJ SOLN: 6.2500 mg | INTRAMUSCULAR | Status: DC | PRN

## 2019-10-09 SURGICAL SUPPLY — 15 items
BLADE BOVIE TIP EXT 4 (BLADE) ×3 IMPLANT
CANISTER SUCT 1200ML W/VALVE (MISCELLANEOUS) ×3 IMPLANT
CATH ROBINSON RED A/P 10FR (CATHETERS) ×3 IMPLANT
COAG SUCT 10F 3.5MM HAND CTRL (MISCELLANEOUS) ×3 IMPLANT
DECANTER SPIKE VIAL GLASS SM (MISCELLANEOUS) ×3 IMPLANT
ELECT REM PT RETURN 9FT ADLT (ELECTROSURGICAL) ×3
ELECTRODE REM PT RTRN 9FT ADLT (ELECTROSURGICAL) ×1 IMPLANT
GLOVE BIO SURGEON STRL SZ7.5 (GLOVE) ×3 IMPLANT
KIT TURNOVER KIT A (KITS) ×3 IMPLANT
NS IRRIG 500ML POUR BTL (IV SOLUTION) ×3 IMPLANT
PACK TONSIL AND ADENOID CUSTOM (PACKS) ×3 IMPLANT
PENCIL SMOKE EVACUATOR (MISCELLANEOUS) ×3 IMPLANT
SLEEVE SUCTION 125 (MISCELLANEOUS) ×3 IMPLANT
SOL ANTI-FOG 6CC FOG-OUT (MISCELLANEOUS) ×1 IMPLANT
SOL FOG-OUT ANTI-FOG 6CC (MISCELLANEOUS) ×2

## 2019-10-09 NOTE — Anesthesia Postprocedure Evaluation (Signed)
Anesthesia Post Note  Patient: Regina [name]  Procedure(s) Performed: TONSILLECTOMY (Bilateral )     Patient location during evaluation: PACU Anesthesia Type: General Level of consciousness: awake and alert Pain management: pain level controlled Vital Signs Assessment: post-procedure vital signs reviewed and stable Respiratory status: spontaneous breathing, nonlabored ventilation, respiratory function stable and patient connected to nasal cannula oxygen Cardiovascular status: blood pressure returned to baseline and stable Postop Assessment: no apparent nausea or vomiting Anesthetic complications: no    Perrie Ragin A  Zacary Bauer

## 2019-10-09 NOTE — H&P (Signed)
History and physical reviewed and will be scanned in later. No change in medical status reported by the patient or family, appears stable for surgery. All questions regarding the procedure answered, and patient (or family if a child) expressed understanding of the procedure. ? ?Regina [name] S Regina [name] ?@TODAY@ ?

## 2019-10-09 NOTE — Anesthesia Procedure Notes (Signed)
Procedure Name: Intubation Date/Time: 10/09/2019 8:19 AM Performed by: Mayme Genta, CRNA Pre-anesthesia Checklist: Patient identified, Emergency Drugs available, Suction available, Patient being monitored and Timeout performed Patient Re-evaluated:Patient Re-evaluated prior to induction Oxygen Delivery Method: Circle system utilized Preoxygenation: Pre-oxygenation with 100% oxygen Induction Type: IV induction Ventilation: Mask ventilation without difficulty Laryngoscope Size: Miller and 2 Grade View: Grade I Tube type: Oral Rae Tube size: 7.0 mm Number of attempts: 1 Placement Confirmation: ETT inserted through vocal cords under direct vision,  positive ETCO2 and breath sounds checked- equal and bilateral Tube secured with: Tape Dental Injury: Teeth and Oropharynx as per pre-operative assessment

## 2019-10-09 NOTE — Op Note (Signed)
10/09/2019  8:55 AM    Regina [name]  [id number]   Pre-Op Diagnosis:  Chronic Tonsillitis  Post-op Diagnosis: Chronic Tonsillitis  Procedure: Tonsillectomy  Surgeon:  Riley Nearing., MD  Anesthesia:  General endotracheal  EBL:  Less than 25 cc  Complications:  None  Findings: 2+ cryptic tonsils. No significant adenoid tissue in nasopharynx  Procedure: The patient was taken to the Operating Room and placed in the supine position.  After induction of general endotracheal anesthesia, the table was turned 90 degrees and the patient was draped in the usual fashion  with the eyes protected.  A mouth gag was inserted into the oral cavity to open the mouth, and examination of the oropharynx showed the uvula was non-bifid. The palate was palpated, and there was no evidence of submucous cleft. Examination of the nasopharynx showed no obstructing adenoids. The right tonsil was grasped with an Allis clamp and resected from the tonsillar fossa in the usual fashion with the Bovie. The left tonsil was resected in the same fashion. The Bovie was used to obtain hemostasis. Each tonsillar fossa was then carefully injected with 0.25% marcaine, avoiding intravascular injection. The nose and throat were irrigated and suctioned to remove any  blood clot. The mouth gag was  removed with no evidence of active bleeding.  The patient was then returned to the anesthesiologist for awakening, and was taken to the Recovery Room in stable condition.  Cultures:  None.  Specimens:  Tonsils.  Disposition:   PACU to home  Plan: Soft, bland diet and push fluids. Take pain medications and prednisone as prescribed. No strenuous activity for 2 weeks. Follow-up in 3 weeks.  Riley Nearing 10/09/2019 8:55 AM

## 2019-10-09 NOTE — Transfer of Care (Signed)
Immediate Anesthesia Transfer of Care Note  Patient: Regina [name]  Procedure(s) Performed: TONSILLECTOMY (Bilateral )  Patient Location: PACU  Anesthesia Type: General  Level of Consciousness: awake, alert  and patient cooperative  Airway and Oxygen Therapy: Patient Spontanous Breathing and Patient connected to supplemental oxygen  Post-op Assessment: Post-op Vital signs reviewed, Patient's Cardiovascular Status Stable, Respiratory Function Stable, Patent Airway and No signs of Nausea or vomiting  Post-op Vital Signs: Reviewed and stable  Complications: No apparent anesthesia complications

## 2019-10-10 ENCOUNTER — Encounter: Payer: Self-pay | Admitting: Otolaryngology

## 2019-10-11 LAB — SURGICAL PATHOLOGY

## 2019-11-21 ENCOUNTER — Ambulatory Visit: Payer: Medicaid Other | Admitting: Gastroenterology

## 2019-11-30 ENCOUNTER — Telehealth: Payer: Self-pay

## 2019-11-30 ENCOUNTER — Other Ambulatory Visit: Payer: Self-pay

## 2019-11-30 DIAGNOSIS — K581 Irritable bowel syndrome with constipation: Secondary | ICD-10-CM

## 2019-11-30 NOTE — Telephone Encounter (Signed)
Pt called to request advice from Dr. Tobi Bastos regarding her symptoms. Pt states her abdomen has been very swollen and has become painful, pt states her abdomen has swollen so much that she's had to wear larger shirts. Pt also states she's very nauseous but hasn't been able vomit. Pt has a bowel movement every night after taking 2 capfuls of Miralax once at bedtime, which causes diarrhea-like stool. Pt wants to know if there's anything she can try to help with the abdominal swelling?  Sn: Pt has an upcoming office visit with Dr. Tobi Bastos on Monday next week.

## 2019-11-30 NOTE — Telephone Encounter (Signed)
Spoke with pt and informed her of Dr. Johnney Killian recommendations. Pt agrees. I informed pt that per radiology scheduling dept, no appointment is needed for the x-ray, pt just needs to walk in at the [facility]. Pt understands.

## 2019-11-30 NOTE — Telephone Encounter (Signed)
Can we get an X ray ? Try activated charcoal tablets

## 2019-12-03 ENCOUNTER — Ambulatory Visit (INDEPENDENT_AMBULATORY_CARE_PROVIDER_SITE_OTHER): Payer: Medicaid Other | Admitting: Gastroenterology

## 2019-12-03 ENCOUNTER — Ambulatory Visit: Payer: Medicaid Other | Admitting: Gastroenterology

## 2019-12-03 ENCOUNTER — Encounter: Payer: Self-pay | Admitting: Gastroenterology

## 2019-12-03 ENCOUNTER — Other Ambulatory Visit: Payer: Self-pay

## 2019-12-03 VITALS — BP 108/74 | HR 80 | Temp 98.5°F | Ht 69.0 in | Wt 185.0 lb

## 2019-12-03 DIAGNOSIS — K641 Second degree hemorrhoids: Secondary | ICD-10-CM

## 2019-12-03 DIAGNOSIS — K625 Hemorrhage of anus and rectum: Secondary | ICD-10-CM

## 2019-12-03 DIAGNOSIS — K581 Irritable bowel syndrome with constipation: Secondary | ICD-10-CM | POA: Diagnosis not present

## 2019-12-03 MED ORDER — DICYCLOMINE HCL 10 MG PO CAPS: 10.0000 mg | ORAL_CAPSULE | Freq: Three times a day (TID) | ORAL | 2 refills | Status: DC

## 2019-12-03 NOTE — Progress Notes (Addendum)
PROCEDURE NOTE:  Patient has been seen by Dr. Tobi Bastos in office today and has asked me to see her for symptomatic hemorrhoids and assess for repeat ligation.  Patient reports that since hemorrhoid ligation, she felt her hemorrhoidal symptoms including rectal bleeding, prolapse and itching has significantly improved.  She had a flareup of her IBS recently when she had severe abdominal bloating and that has resulted in prolapse of the hemorrhoid associated with severe perianal itching.  The prolapse has since then spontaneously reduced.  Her bloating has also resolved.  She is taking activated charcoal which helps with her IBS symptoms.  She does report early morning nausea almost daily and she forgot to mention it to Dr. Tobi Bastos today.  The patient presents with symptomatic grade 2 hemorrhoids, unresponsive to maximal medical therapy, requesting rubber band ligation of his/her hemorrhoidal disease.  All risks, benefits and alternative forms of therapy were described and informed consent was obtained.  In the Left Lateral Decubitus position anoscopic examination revealed grade 2 hemorrhoids in the RP position(s).  Normal perianal exam, no skin tags or prolapsed external hemorrhoids evident.  The decision was made to band the RP internal hemorrhoid, and the [facility] System was used to perform band ligation without complication.  Digital anorectal examination was then performed to assure proper positioning of the band, and to adjust the banded tissue as required.  The patient was discharged home without pain or other issues.  Dietary and behavioral recommendations were given and (if necessary - prescriptions were given), along with follow-up instructions.  The patient will return  as needed for follow-up and possible additional banding as required.  Samples of calmoseptine paste were given to patient for perianal itching  No complications were encountered and the patient tolerated the procedure well.

## 2019-12-03 NOTE — Progress Notes (Signed)
Jonathon Bellows MD, MRCP(U.K) [address]  [place]  [place], [place] [address]  Main: [phone]  Fax: [phone]   Primary Care Physician: [facility]  Primary Gastroenterologist:  Dr. Jonathon Bellows   No chief complaint on file.   HPI: Regina [name] is a 29 y.o. female   Summary of history :  She she has been followed for irritable bowel syndrome with constipation. Ongoing her whole life. [place] mother had colon cancer. Previously used to take [facility] powders and has stopped.She has tried other agents in the past including Amitiza/Linzess/Motegrity which have not worked.  MiraLAX did not work.  10/18/2018 : TSH-normal 10/23/18: colonoscopy -normal     Interval history 05/22/2019-12/03/2019  She has undergone band ligation of internal hemorrhoids by Dr. Marius Ditch.  Last was seen on August 29, 2019 She states that she has been having a lot of cramping on both lower part of her abdomen stimulating a menstrual cramp.  Better after bowel movement.  Denies she had any constipation.  Takes charcoal tablets for bloating and daily 2 capfuls of MiraLAX at night.  He is also complaining of perianal itching and occasional blood on the toilet paper when she wipes.   Current Outpatient Medications  Medication Sig Dispense Refill  . Fremanezumab-vfrm (AJOVY) 225 MG/1.5ML SOAJ Inject into the skin every 30 (thirty) days.    Marland Kitchen HYDROcodone-acetaminophen (HYCET) 7.5-325 mg/15 ml solution 10-15cc PO every 4-6 hours as needed for pain 300 mL 0  . omeprazole (PRILOSEC) 40 MG capsule Take by mouth.    . prednisoLONE (ORAPRED) 15 MG/5ML solution 10cc PO BID x 3 days, then 5cc PO BID x 3 days, then 5cc PO QD x 3 days 120 mL 0  . rizatriptan (MAXALT-MLT) 10 MG disintegrating tablet TAKE 1 TAB AT HEADACHE ONSET, CAN REPEAT ONCE IN 2 HOURS IF NEEDED. NO MORE THAN 2 TABS IN 24 HOURS     No current facility-administered medications for this visit.    Allergies as of 12/03/2019   . (No Known Allergies)    ROS:  General: Negative for anorexia, weight loss, fever, chills, fatigue, weakness. ENT: Negative for hoarseness, difficulty swallowing , nasal congestion. CV: Negative for chest pain, angina, palpitations, dyspnea on exertion, peripheral edema.  Respiratory: Negative for dyspnea at rest, dyspnea on exertion, cough, sputum, wheezing.  GI: See history of present illness. GU:  Negative for dysuria, hematuria, urinary incontinence, urinary frequency, nocturnal urination.  Endo: Negative for unusual weight change.    Physical Examination:   There were no vitals taken for this visit.  General: Well-nourished, well-developed in no acute distress.  Eyes: No icterus. Conjunctivae pink. Mouth: Oropharyngeal mucosa moist and pink , no lesions erythema or exudate. Lungs: Clear to auscultation bilaterally. Non-labored. Heart: Regular rate and rhythm, no murmurs rubs or gallops.  Abdomen: Bowel sounds are normal, nontender, nondistended, no hepatosplenomegaly or masses, no abdominal bruits or hernia , no rebound or guarding.   Extremities: No lower extremity edema. No clubbing or deformities. Neuro: Alert and oriented x 3.  Grossly intact. Skin: Warm and dry, no jaundice.   Psych: Alert and cooperative, normal mood and affect.   Imaging Studies: No results found.  Assessment and Plan:   Regina [name] is a 29 y.o. y/o female here to follow up forsevere IBS-C.  On MiraLAX.  Daily bowel movements.  She has a lot of bloating and abdominal cramping.  Relieved after bowel movement.  Plan  1.Continuemiralax daily 1-2 times ,  add dicyclomine up to 4 times a day.  If no better at next visit add senna or try Trulance 2.  I will have her see Dr. Allegra Lai who has banded her hemorrhoids previously as I suspect she will need further banding today   Dr Wyline Mood  MD,MRCP [facility]) Follow up in 2 weeks telephone visit

## 2019-12-17 ENCOUNTER — Telehealth: Payer: Self-pay | Admitting: Gastroenterology

## 2019-12-17 ENCOUNTER — Ambulatory Visit (INDEPENDENT_AMBULATORY_CARE_PROVIDER_SITE_OTHER): Payer: Medicaid Other | Admitting: Gastroenterology

## 2019-12-17 ENCOUNTER — Encounter: Payer: Self-pay | Admitting: Gastroenterology

## 2019-12-17 DIAGNOSIS — K641 Second degree hemorrhoids: Secondary | ICD-10-CM | POA: Diagnosis not present

## 2019-12-17 DIAGNOSIS — K625 Hemorrhage of anus and rectum: Secondary | ICD-10-CM

## 2019-12-17 LAB — CBC
Hematocrit: 38 % (ref 34.0–46.6)
Hemoglobin: 12.2 g/dL (ref 11.1–15.9)
MCH: 27.2 pg (ref 26.6–33.0)
MCHC: 32.1 g/dL (ref 31.5–35.7)
MCV: 85 fL (ref 79–97)
Platelets: 310 10*3/uL (ref 150–450)
RBC: 4.48 x10E6/uL (ref 3.77–5.28)
RDW: 13.4 % (ref 11.7–15.4)
WBC: 6.8 10*3/uL (ref 3.4–10.8)

## 2019-12-17 NOTE — Telephone Encounter (Signed)
error 

## 2019-12-17 NOTE — Progress Notes (Signed)
Jonathon Bellows , MD [address]  [place]  [place], [place] [address]  Main: [phone]  Fax: [phone]   Primary Care Physician: [facility]  Virtual Visit via Telephone Note  I connected with patient on 12/17/19 at 11:15 AM EST by telephone and verified that I am speaking with the correct person using two identifiers.   I discussed the limitations, risks, security and privacy concerns of performing an evaluation and management service by telephone and the availability of in person appointments. I also discussed with the patient that there may be a patient responsible charge related to this service. The patient expressed understanding and agreed to proceed.  Location of Patient: Home Location of Provider: Home Persons involved: Patient and provider only   History of Present Illness: Chief Complaint  Patient presents with  . Rectal Bleeding    Patient has been having rectal bleeding     HPI: Regina [name] is a 29 y.o. female     Summary of history :  She she has been followed for irritable bowel syndrome with constipation.Ongoing her whole life. [place] mother had colon cancer. Previously used to take [facility] powders and has stopped.She has tried other agents in the past including Amitiza/Linzess/Motegrity which have not worked.  MiraLAX did not work.  10/18/2018 : TSH-normal 10/23/18: colonoscopy -normal She has undergone band ligation of internal hemorrhoids by Dr. Marius Ditch.  Last was seen on 12/03/2019  Interval history 05/22/2019-12/03/2019  Since her last visit she was doing okay but over the last few days she is having bleeding associated with the bowel movement.  This morning she is stated that the toilet bowl is full of blood.  Her constipation is better but not consistent.  She takes 2 capfuls of MiraLAX every day at night.     Current Outpatient Medications  Medication Sig Dispense Refill  . dicyclomine (BENTYL) 10 MG capsule Take 1  capsule (10 mg total) by mouth 4 (four) times daily -  before meals and at bedtime. 120 capsule 2  . fluticasone (FLONASE) 50 MCG/ACT nasal spray Place 1 spray into both nostrils 2 (two) times daily.    . Fremanezumab-vfrm (AJOVY) 225 MG/1.5ML SOAJ Inject into the skin every 30 (thirty) days.    Marland Kitchen omeprazole (PRILOSEC) 40 MG capsule Take by mouth.    . rizatriptan (MAXALT-MLT) 10 MG disintegrating tablet TAKE 1 TAB AT HEADACHE ONSET, CAN REPEAT ONCE IN 2 HOURS IF NEEDED. NO MORE THAN 2 TABS IN 24 HOURS     No current facility-administered medications for this visit.    Allergies as of 12/17/2019  . (No Known Allergies)    Review of Systems:    All systems reviewed and negative except where noted in HPI.   Observations/Objective:  Labs: CMP     Component Value Date/Time   NA 139 09/09/2018 2049   NA 139 07/09/2012 1838   K 3.7 09/09/2018 2049   K 3.2 (L) 07/09/2012 1838   CL 105 09/09/2018 2049   CL 106 07/09/2012 1838   CO2 26 09/09/2018 2049   CO2 28 07/09/2012 1838   GLUCOSE 119 (H) 09/09/2018 2049   GLUCOSE 91 07/09/2012 1838   BUN 10 09/09/2018 2049   BUN 9 07/09/2012 1838   CREATININE 0.64 09/09/2018 2049   CREATININE 0.76 07/09/2012 1838   CALCIUM 9.2 09/09/2018 2049   CALCIUM 8.8 07/09/2012 1838   PROT 7.8 09/09/2018 2049   PROT 8.0 07/09/2012 1838   ALBUMIN 4.2  09/09/2018 2049   ALBUMIN 4.0 07/09/2012 1838   AST 17 09/09/2018 2049   AST 17 07/09/2012 1838   ALT 18 09/09/2018 2049   ALT 14 07/09/2012 1838   ALKPHOS 60 09/09/2018 2049   ALKPHOS 70 07/09/2012 1838   BILITOT 0.1 (L) 09/09/2018 2049   BILITOT 0.3 07/09/2012 1838   GFRNONAA >60 09/09/2018 2049   GFRNONAA >60 07/09/2012 1838   GFRAA >60 09/09/2018 2049   GFRAA >60 07/09/2012 1838   Lab Results  Component Value Date   WBC 8.3 09/09/2018   HGB 12.1 09/09/2018   HCT 38.8 09/09/2018   MCV 88.4 09/09/2018   PLT 329 09/09/2018    Imaging Studies: No results found.  Assessment and Plan:    Regina [name] is a 29 y.o. y/o female here to follow up for rectal bleeding secondary to internal hemorrhoids and constipation.  Her constipation is better since her last visit but not completely resolved.  She seems to have right bright red blood per rectum associated with the bowel movements likely secondary to her hemorrhoids.  She recently underwent banding of her hemorrhoids by Dr. Allegra Lai.   In the meanwhile advised her to increase her MiraLAX to 3 times a day, avoid excessive rubbing of her anal area with toilet paper after bowel movement rather use shower to wash herself off.  Sit in a warm water bath at night to soak the hemorrhoids.  I discussed with Dr. Allegra Lai and it was felt that it was better to refer her for surgical evaluation and management of her hemorrhoids.  We will also check a CBC in the meanwhile.    I discussed the assessment and treatment plan with the patient. The patient was provided an opportunity to ask questions and all were answered. The patient agreed with the plan and demonstrated an understanding of the instructions.   The patient was advised to call back or seek an in-person evaluation if the symptoms worsen or if the condition fails to improve as anticipated.  I provided 12 minutes of non-face-to-face time during this encounter.  Dr Wyline Mood MD,MRCP [facility]) Gastroenterology/Hepatology Pager: [phone]   Speech recognition software was used to dictate this note.

## 2019-12-18 ENCOUNTER — Telehealth: Payer: Self-pay

## 2019-12-18 NOTE — Progress Notes (Signed)
Inform Hb stable , refer to surgery for hemorroid treatment - refer to colorectal surgeon . If bleeding worsens in the meanwhile to call us back

## 2019-12-18 NOTE — Telephone Encounter (Signed)
-----   Message from Wyline Mood, MD sent at 12/18/2019  7:32 AM EST ----- Inform Hb stable , refer to surgery for hemorroid treatment - refer to colorectal surgeon . If bleeding worsens in the meanwhile to call us back

## 2019-12-18 NOTE — Telephone Encounter (Signed)
Patient verbalized understanding. Have already referred to Dr. Everlene Farrier going to refer to [facility] white also.

## 2019-12-19 ENCOUNTER — Ambulatory Visit (INDEPENDENT_AMBULATORY_CARE_PROVIDER_SITE_OTHER): Payer: Medicaid Other | Admitting: Surgery

## 2019-12-19 ENCOUNTER — Encounter: Payer: Self-pay | Admitting: Surgery

## 2019-12-19 ENCOUNTER — Other Ambulatory Visit: Payer: Self-pay

## 2019-12-19 VITALS — BP 109/73 | HR 90 | Temp 97.9°F | Resp 12 | Ht 69.0 in | Wt 185.8 lb

## 2019-12-19 DIAGNOSIS — K602 Anal fissure, unspecified: Secondary | ICD-10-CM | POA: Diagnosis not present

## 2019-12-19 NOTE — Patient Instructions (Addendum)
We will call in a prescription (Nifedipine Cream) to the pharmacy [facility] [place], [place], [place] [address]. Insurance will not cover this prescription. It will be out of pocket pay about $45.00. Begin doing sitz baths as well.  We will see you in about 4 weeks for a follow up with Dr. Dahlia Byes. Please see your follow up appointment below.   Please call the office if you have any questions or concerns.  Hemorrhoids Hemorrhoids are swollen veins in and around the rectum or anus. There are two types of hemorrhoids:  Internal hemorrhoids. These occur in the veins that are just inside the rectum. They may poke through to the outside and become irritated and painful.  External hemorrhoids. These occur in the veins that are outside the anus and can be felt as a painful swelling or hard lump near the anus. Most hemorrhoids do not cause serious problems, and they can be managed with home treatments such as diet and lifestyle changes. If home treatments do not help the symptoms, procedures can be done to shrink or remove the hemorrhoids. What are the causes? This condition is caused by increased pressure in the anal area. This pressure may result from various things, including:  Constipation.  Straining to have a bowel movement.  Diarrhea.  Pregnancy.  Obesity.  Sitting for long periods of time.  Heavy lifting or other activity that causes you to strain.  Anal sex.  Riding a bike for a long period of time. What are the signs or symptoms? Symptoms of this condition include:  Pain.  Anal itching or irritation.  Rectal bleeding.  Leakage of stool (feces).  Anal swelling.  One or more lumps around the anus. How is this diagnosed? This condition can often be diagnosed through a visual exam. Other exams or tests may also be done, such as:  An exam that involves feeling the rectal area with a gloved hand (digital rectal exam).  An exam of the anal canal that is done using a  small tube (anoscope).  A blood test, if you have lost a significant amount of blood.  A test to look inside the colon using a flexible tube with a camera on the end (sigmoidoscopy or colonoscopy). How is this treated? This condition can usually be treated at home. However, various procedures may be done if dietary changes, lifestyle changes, and other home treatments do not help your symptoms. These procedures can help make the hemorrhoids smaller or remove them completely. Some of these procedures involve surgery, and others do not. Common procedures include:  Rubber band ligation. Rubber bands are placed at the base of the hemorrhoids to cut off their blood supply.  Sclerotherapy. Medicine is injected into the hemorrhoids to shrink them.  Infrared coagulation. A type of light energy is used to get rid of the hemorrhoids.  Hemorrhoidectomy surgery. The hemorrhoids are surgically removed, and the veins that supply them are tied off.  Stapled hemorrhoidopexy surgery. The surgeon staples the base of the hemorrhoid to the rectal wall. Follow these instructions at home: Eating and drinking   Eat foods that have a lot of fiber in them, such as whole grains, beans, nuts, fruits, and vegetables.  Ask your health care provider about taking products that have added fiber (fiber supplements).  Reduce the amount of fat in your diet. You can do this by eating low-fat dairy products, eating less red meat, and avoiding processed foods.  Drink enough fluid to keep your urine  pale yellow. Managing pain and swelling   Take warm sitz baths for 20 minutes, 3-4 times a day to ease pain and discomfort. You may do this in a bathtub or using a portable sitz bath that fits over the toilet.  If directed, apply ice to the affected area. Using ice packs between sitz baths may be helpful. ? Put ice in a plastic bag. ? Place a towel between your skin and the bag. ? Leave the ice on for 20 minutes, 2-3 times  a day. General instructions  Take over-the-counter and prescription medicines only as told by your health care provider.  Use medicated creams or suppositories as told.  Get regular exercise. Ask your health care provider how much and what kind of exercise is best for you. In general, you should do moderate exercise for at least 30 minutes on most days of the week (150 minutes each week). This can include activities such as walking, biking, or yoga.  Go to the bathroom when you have the urge to have a bowel movement. Do not wait.  Avoid straining to have bowel movements.  Keep the anal area dry and clean. Use wet toilet paper or moist towelettes after a bowel movement.  Do not sit on the toilet for long periods of time. This increases blood pooling and pain.  Keep all follow-up visits as told by your health care provider. This is important. Contact a health care provider if you have:  Increasing pain and swelling that are not controlled by treatment or medicine.  Difficulty having a bowel movement, or you are unable to have a bowel movement.  Pain or inflammation outside the area of the hemorrhoids. Get help right away if you have:  Uncontrolled bleeding from your rectum. Summary  Hemorrhoids are swollen veins in and around the rectum or anus.  Most hemorrhoids can be managed with home treatments such as diet and lifestyle changes.  Taking warm sitz baths can help ease pain and discomfort.  In severe cases, procedures or surgery can be done to shrink or remove the hemorrhoids. This information is not intended to replace advice given to you by your health care provider. Make sure you discuss any questions you have with your health care provider. Document Revised: 03/23/2019 Document Reviewed: 03/16/2018 Elsevier Patient Education  2020 [employer].

## 2019-12-24 NOTE — Progress Notes (Signed)
Patient ID: Regina [name], female   DOB: Apr 07, 1991, 29 y.o.   MRN: [id number]  HPI Regina [name] is a 29 y.o. female seen in consultation at the request of Dr. Vicente Males and Dr. Marius Ditch from GI.  She does have a history of irritable bowel syndrome with constipation.  She did have history of hematochezia and has had already a colonoscopy that was normal that was done in 2019.  Please note that I have personally reviewed the images.  She also underwent hemorrhoid banding by Dr. Marius Ditch on December 03, 2019.  She does report that over the last week or so her hematochezia has intensified.  She does also reports pain in the anorectal area that is intermittent and exacerbated by bowel movements.  Pain is sharp and moderate intensity.  She denies any fevers and chills I have personally reviewed the CT scan in 2019 showing no acute abnormalities may be just some thickening of the descending colon.  Her CBC was completely normal as well. She is able to perform more than 4 METS of activity without any shortness of breath or chest pain.   HPI  Past Medical History:  Diagnosis Date  . ADHD (attention deficit hyperactivity disorder)   . Anemia   . Arthritis   . GERD (gastroesophageal reflux disease)   . Migraine headache    about 1x/week since starting Ajovy (post concussion syndome)  . PONV (postoperative nausea and vomiting)   . Scoliosis     Past Surgical History:  Procedure Laterality Date  . BREAST SURGERY Right    Cyst removal  . COLONOSCOPY WITH PROPOFOL N/A 10/23/2018   Procedure: COLONOSCOPY WITH PROPOFOL;  Surgeon: Jonathon Bellows, MD;  [facility] ENDOSCOPY;  Service: Gastroenterology;  Laterality: N/A;  . TONSILLECTOMY Bilateral 10/09/2019   Procedure: TONSILLECTOMY;  Surgeon: Clyde Canterbury, MD;  Location: [facility];  Service: ENT;  Laterality: Bilateral;    History reviewed. No pertinent family history.  Social History Social History   Tobacco Use  . Smoking status: Never  Smoker  . Smokeless tobacco: Never Used  Substance Use Topics  . Alcohol use: No  . Drug use: Not Currently    Types: Cocaine    No Known Allergies  Current Outpatient Medications  Medication Sig Dispense Refill  . dicyclomine (BENTYL) 10 MG capsule Take 1 capsule (10 mg total) by mouth 4 (four) times daily -  before meals and at bedtime. 120 capsule 2  . fluticasone (FLONASE) 50 MCG/ACT nasal spray Place 1 spray into both nostrils 2 (two) times daily.    . Fremanezumab-vfrm (AJOVY) 225 MG/1.5ML SOAJ Inject into the skin every 30 (thirty) days.    Marland Kitchen omeprazole (PRILOSEC) 40 MG capsule Take by mouth.    . rizatriptan (MAXALT-MLT) 10 MG disintegrating tablet TAKE 1 TAB AT HEADACHE ONSET, CAN REPEAT ONCE IN 2 HOURS IF NEEDED. NO MORE THAN 2 TABS IN 24 HOURS    . Ubrogepant (UBRELVY) 100 MG TABS Take by mouth.     No current facility-administered medications for this visit.     Review of Systems Full ROS  was asked and was negative except for the information on the HPI  Physical Exam Blood pressure 109/73, pulse 90, temperature 97.9 F (36.6 C), resp. rate 12, height 5\' 9"  (2.831 m), weight 185 lb 12.8 oz (84.3 kg), SpO2 97 %. CONSTITUTIONAL: NAD EYES: Pupils are equal, round, and reactive to light, Sclera are non-icteric. EARS, NOSE, MOUTH AND THROAT: Wearing a mask ( Covid precautions)Hearing  is intact to voice. LYMPH NODES:  Lymph nodes in the neck are normal. RESPIRATORY:  Lungs are clear. There is normal respiratory effort, with equal breath sounds bilaterally, and without pathologic use of accessory muscles. CARDIOVASCULAR: Heart is regular without murmurs, gallops, or rubs. GI: The abdomen is soft, nontender, and nondistended. There are no palpable masses. There is no hepatosplenomegaly. There are normal bowel sounds in all quadrants.  Rectal: There is no evidence of any masses.  The rectal exam is very tender specifically in the posterior midline.  I do see a small fissure  posterior midline.  I do not see any hemorrhoids. MUSCULOSKELETAL: Normal muscle strength and tone. No cyanosis or edema.   SKIN: Turgor is good and there are no pathologic skin lesions or ulcers. NEUROLOGIC: Motor and sensation is grossly normal. Cranial nerves are grossly intact. PSYCH:  Oriented to person, place and time. Affect is normal.  Data Reviewed  I have personally reviewed the patient's imaging, laboratory findings and medical records.    Assessment/Plan  29 year old female with signs and symptoms consistent with anal fissure.  Currently I do not see any hemorrhoids.  Discussed with patient in detail about her disease process.  Recommend sitz bath's, nifedipine cream and following up in 3 weeks.  She may benefit from a chemical sphincterotomy.  We will try nifedipine cream and sitz bath's initially.  No need for any emergent surgical intervention at this time.     Sterling Big, MD FACS General Surgeon 12/24/2019, 11:23 AM

## 2020-01-16 ENCOUNTER — Ambulatory Visit (INDEPENDENT_AMBULATORY_CARE_PROVIDER_SITE_OTHER): Payer: Medicaid Other | Admitting: Surgery

## 2020-01-16 ENCOUNTER — Other Ambulatory Visit: Payer: Self-pay

## 2020-01-16 ENCOUNTER — Encounter: Payer: Self-pay | Admitting: Surgery

## 2020-01-16 VITALS — BP 112/74 | HR 71 | Temp 97.9°F | Resp 13 | Ht 69.0 in | Wt 181.0 lb

## 2020-01-16 DIAGNOSIS — K602 Anal fissure, unspecified: Secondary | ICD-10-CM | POA: Diagnosis not present

## 2020-01-16 NOTE — Patient Instructions (Signed)
We will schedule you for surgery to do an exam under anesthesia with possible Botox injection.  Our surgery scheduler will be in contact with you go look at surgery dates and go over information.  Continue sitz baths and using your nifedipine cream.

## 2020-01-18 ENCOUNTER — Encounter: Payer: Self-pay | Admitting: Surgery

## 2020-01-18 NOTE — Progress Notes (Signed)
Outpatient Surgical Follow Up  01/18/2020  Regina [name] is an 29 y.o. female.   Chief Complaint  Patient presents with  . Follow-up    HPI: Regina [name] is following up.  She continues to have intermittent anorectal pain that is mild to moderate intensity.  She complains of a lot of itching.  Some hematochezia.  No fevers no chills.  She is started on nifedipine cream with only minimal relief.  Past Medical History:  Diagnosis Date  . ADHD (attention deficit hyperactivity disorder)   . Anemia   . Arthritis   . GERD (gastroesophageal reflux disease)   . Migraine headache    about 1x/week since starting Ajovy (post concussion syndome)  . PONV (postoperative nausea and vomiting)   . Scoliosis     Past Surgical History:  Procedure Laterality Date  . BREAST SURGERY Right    Cyst removal  . COLONOSCOPY WITH PROPOFOL N/A 10/23/2018   Procedure: COLONOSCOPY WITH PROPOFOL;  Surgeon: Wyline Mood, MD;  Location: [facility] ENDOSCOPY;  Service: Gastroenterology;  Laterality: N/A;  . TONSILLECTOMY Bilateral 10/09/2019   Procedure: TONSILLECTOMY;  Surgeon: Geanie Logan, MD;  Location: [facility];  Service: ENT;  Laterality: Bilateral;    No family history on file.  Social History:  reports that she has never smoked. She has never used smokeless tobacco. She reports previous drug use. Drug: Cocaine. She reports that she does not drink alcohol.  Allergies: No Known Allergies  Medications reviewed.    ROS Full ROS performed and is otherwise negative other than what is stated in HPI   BP 112/74   Pulse 71   Temp 97.9 F (36.6 C)   Resp 13   Ht 5\' 9"  (1.753 m)   Wt 181 lb (82.1 kg)   SpO2 98%   BMI 26.73 kg/m   Physical Exam Vitals and nursing note reviewed. Exam conducted with a chaperone present.  Constitutional:      General: She is not in acute distress.    Appearance: Normal appearance. She is not ill-appearing.  Eyes:     General: No scleral icterus.       Right  eye: No discharge.        Left eye: No discharge.  Neck:     Vascular: No carotid bruit.  Cardiovascular:     Rate and Rhythm: Normal rate and regular rhythm.  Pulmonary:     Effort: Pulmonary effort is normal. No respiratory distress.     Breath sounds: Normal breath sounds. No stridor. No wheezing or rhonchi.  Abdominal:     General: Abdomen is flat. There is no distension.     Palpations: Abdomen is soft. There is no mass.     Tenderness: There is no abdominal tenderness. There is no guarding or rebound.     Hernia: No hernia is present.  Genitourinary:    Comments: Evidence of post fissure, no masses, increase sphincter tone Musculoskeletal:        General: No swelling, tenderness or deformity. Normal range of motion.     Cervical back: Normal range of motion and neck supple. No rigidity or tenderness.  Lymphadenopathy:     Cervical: No cervical adenopathy.  Skin:    General: Skin is warm and dry.     Capillary Refill: Capillary refill takes less than 2 seconds.  Neurological:     General: No focal deficit present.     Mental Status: She is alert and oriented to person, place, and time.  Psychiatric:  Mood and Affect: Mood normal.        Behavior: Behavior normal.        Thought Content: Thought content normal.        Judgment: Judgment normal.        Assessment/Plan: 29-year-old female with classic signs and symptoms consistent with anal fissure.  She was not responsive to nifedipine cream. We Will be to proceed to the operating room for exam under anesthesia and chemical sphincterotomy with Botox.  Procedure discussed with the patient detail.  Risks, benefits and possible complicatuions including but not limited to: Bleeding, infection, incontinence, persistent pain and recurrence.  She understands and wished to proceed   Greater than 50% of the 25 minutes  visit was spent in counseling/coordination of care   Bronte Kropf, MD FACS General Surgeon 

## 2020-01-18 NOTE — H&P (View-Only) (Signed)
Outpatient Surgical Follow Up  01/18/2020  Regina [name] is an 29 y.o. female.   Chief Complaint  Patient presents with  . Follow-up    HPI: Regina [name] is following up.  She continues to have intermittent anorectal pain that is mild to moderate intensity.  She complains of a lot of itching.  Some hematochezia.  No fevers no chills.  She is started on nifedipine cream with only minimal relief.  Past Medical History:  Diagnosis Date  . ADHD (attention deficit hyperactivity disorder)   . Anemia   . Arthritis   . GERD (gastroesophageal reflux disease)   . Migraine headache    about 1x/week since starting Ajovy (post concussion syndome)  . PONV (postoperative nausea and vomiting)   . Scoliosis     Past Surgical History:  Procedure Laterality Date  . BREAST SURGERY Right    Cyst removal  . COLONOSCOPY WITH PROPOFOL N/A 10/23/2018   Procedure: COLONOSCOPY WITH PROPOFOL;  Surgeon: Wyline Mood, MD;  Location: [facility] ENDOSCOPY;  Service: Gastroenterology;  Laterality: N/A;  . TONSILLECTOMY Bilateral 10/09/2019   Procedure: TONSILLECTOMY;  Surgeon: Geanie Logan, MD;  Location: [facility];  Service: ENT;  Laterality: Bilateral;    No family history on file.  Social History:  reports that she has never smoked. She has never used smokeless tobacco. She reports previous drug use. Drug: Cocaine. She reports that she does not drink alcohol.  Allergies: No Known Allergies  Medications reviewed.    ROS Full ROS performed and is otherwise negative other than what is stated in HPI   BP 112/74   Pulse 71   Temp 97.9 F (36.6 C)   Resp 13   Ht 5\' 9"  (1.753 m)   Wt 181 lb (82.1 kg)   SpO2 98%   BMI 26.73 kg/m   Physical Exam Vitals and nursing note reviewed. Exam conducted with a chaperone present.  Constitutional:      General: She is not in acute distress.    Appearance: Normal appearance. She is not ill-appearing.  Eyes:     General: No scleral icterus.       Right  eye: No discharge.        Left eye: No discharge.  Neck:     Vascular: No carotid bruit.  Cardiovascular:     Rate and Rhythm: Normal rate and regular rhythm.  Pulmonary:     Effort: Pulmonary effort is normal. No respiratory distress.     Breath sounds: Normal breath sounds. No stridor. No wheezing or rhonchi.  Abdominal:     General: Abdomen is flat. There is no distension.     Palpations: Abdomen is soft. There is no mass.     Tenderness: There is no abdominal tenderness. There is no guarding or rebound.     Hernia: No hernia is present.  Genitourinary:    Comments: Evidence of post fissure, no masses, increase sphincter tone Musculoskeletal:        General: No swelling, tenderness or deformity. Normal range of motion.     Cervical back: Normal range of motion and neck supple. No rigidity or tenderness.  Lymphadenopathy:     Cervical: No cervical adenopathy.  Skin:    General: Skin is warm and dry.     Capillary Refill: Capillary refill takes less than 2 seconds.  Neurological:     General: No focal deficit present.     Mental Status: She is alert and oriented to person, place, and time.  Psychiatric:  Mood and Affect: Mood normal.        Behavior: Behavior normal.        Thought Content: Thought content normal.        Judgment: Judgment normal.        Assessment/Plan: 29 year old female with classic signs and symptoms consistent with anal fissure.  She was not responsive to nifedipine cream. We Will be to proceed to the operating room for exam under anesthesia and chemical sphincterotomy with Botox.  Procedure discussed with the patient detail.  Risks, benefits and possible complicatuions including but not limited to: Bleeding, infection, incontinence, persistent pain and recurrence.  She understands and wished to proceed   Greater than 50% of the 25 minutes  visit was spent in counseling/coordination of care   Caroleen Hamman, MD [place] Surgeon

## 2020-01-21 ENCOUNTER — Other Ambulatory Visit
Admission: RE | Admit: 2020-01-21 | Discharge: 2020-01-21 | Disposition: A | Discharge status: Home / Self Care | Payer: Medicaid Other | Source: Ambulatory Visit | Attending: Surgery | Admitting: Surgery

## 2020-01-21 ENCOUNTER — Telehealth: Payer: Self-pay | Admitting: Surgery

## 2020-01-21 DIAGNOSIS — Z01812 Encounter for preprocedural laboratory examination: Secondary | ICD-10-CM | POA: Diagnosis not present

## 2020-01-21 DIAGNOSIS — Z20822 Contact with and (suspected) exposure to covid-19: Secondary | ICD-10-CM | POA: Diagnosis not present

## 2020-01-21 LAB — SARS CORONAVIRUS 2 (TAT 6-24 HRS): SARS Coronavirus 2: NEGATIVE

## 2020-01-21 NOTE — Telephone Encounter (Signed)
Pt has been advised of pre admission date/time, Covid Testing date and Surgery date.  Surgery Date: 01/22/20 Preadmission Testing Date: 2 hrs early on day of sx Covid Testing Date: 01/21/20 - patient advised to go to [facility] ([address] [facility])  Patient has been made aware to call [phone], between 1-3:00pm the day before surgery, to find out what time to arrive.

## 2020-01-22 ENCOUNTER — Encounter
Admission: RE | Disposition: A | Discharge status: Home / Self Care | Payer: Self-pay | Source: Home / Self Care | Attending: Surgery

## 2020-01-22 ENCOUNTER — Ambulatory Visit: Payer: Medicaid Other | Admitting: Anesthesiology

## 2020-01-22 ENCOUNTER — Ambulatory Visit
Admission: RE | Admit: 2020-01-22 | Discharge: 2020-01-22 | Disposition: A | Discharge status: Home / Self Care | Payer: Medicaid Other | Attending: Surgery | Admitting: Surgery

## 2020-01-22 ENCOUNTER — Encounter: Payer: Self-pay | Admitting: Surgery

## 2020-01-22 ENCOUNTER — Other Ambulatory Visit: Payer: Self-pay

## 2020-01-22 DIAGNOSIS — F909 Attention-deficit hyperactivity disorder, unspecified type: Secondary | ICD-10-CM | POA: Diagnosis not present

## 2020-01-22 DIAGNOSIS — M1990 Unspecified osteoarthritis, unspecified site: Secondary | ICD-10-CM | POA: Insufficient documentation

## 2020-01-22 DIAGNOSIS — Z6826 Body mass index (BMI) 26.0-26.9, adult: Secondary | ICD-10-CM | POA: Diagnosis not present

## 2020-01-22 DIAGNOSIS — K602 Anal fissure, unspecified: Secondary | ICD-10-CM | POA: Insufficient documentation

## 2020-01-22 DIAGNOSIS — M419 Scoliosis, unspecified: Secondary | ICD-10-CM | POA: Insufficient documentation

## 2020-01-22 DIAGNOSIS — E669 Obesity, unspecified: Secondary | ICD-10-CM | POA: Insufficient documentation

## 2020-01-22 DIAGNOSIS — K219 Gastro-esophageal reflux disease without esophagitis: Secondary | ICD-10-CM | POA: Insufficient documentation

## 2020-01-22 HISTORY — PX: SPHINCTEROTOMY: SHX5279

## 2020-01-22 LAB — POCT PREGNANCY, URINE: Preg Test, Ur: NEGATIVE

## 2020-01-22 SURGERY — EXAM UNDER ANESTHESIA
Anesthesia: General

## 2020-01-22 MED ORDER — CHLORHEXIDINE GLUCONATE CLOTH 2 % EX PADS: 6.0000 | MEDICATED_PAD | Freq: Once | CUTANEOUS | Status: DC

## 2020-01-22 MED ORDER — LIDOCAINE HCL (PF) 2 % IJ SOLN
INTRAMUSCULAR | Status: DC | PRN
  Administered 2020-01-22: 40 mg via INTRADERMAL

## 2020-01-22 MED ORDER — ONDANSETRON HCL 4 MG/2ML IJ SOLN
INTRAMUSCULAR | Status: AC
  Filled 2020-01-22: qty 2

## 2020-01-22 MED ORDER — ONDANSETRON HCL 4 MG/2ML IJ SOLN: 4.0000 mg | Freq: Once | INTRAMUSCULAR | Status: DC | PRN

## 2020-01-22 MED ORDER — PROPOFOL 10 MG/ML IV BOLUS
INTRAVENOUS | Status: AC
  Filled 2020-01-22: qty 20

## 2020-01-22 MED ORDER — LIDOCAINE HCL URETHRAL/MUCOSAL 2 % EX GEL
CUTANEOUS | Status: DC | PRN
  Administered 2020-01-22: 1 via TOPICAL

## 2020-01-22 MED ORDER — HYDROCODONE-ACETAMINOPHEN 5-325 MG PO TABS: 1.0000 | ORAL_TABLET | Freq: Four times a day (QID) | ORAL | 0 refills | Status: DC | PRN

## 2020-01-22 MED ORDER — ONDANSETRON HCL 4 MG/2ML IJ SOLN
INTRAMUSCULAR | Status: DC | PRN
  Administered 2020-01-22: 4 mg via INTRAVENOUS

## 2020-01-22 MED ORDER — FENTANYL CITRATE (PF) 100 MCG/2ML IJ SOLN
INTRAMUSCULAR | Status: AC
  Filled 2020-01-22: qty 2

## 2020-01-22 MED ORDER — LACTATED RINGERS IV SOLN: INTRAVENOUS | Status: DC

## 2020-01-22 MED ORDER — BUPIVACAINE LIPOSOME 1.3 % IJ SUSP
INTRAMUSCULAR | Status: AC
  Filled 2020-01-22: qty 20

## 2020-01-22 MED ORDER — PROPOFOL 500 MG/50ML IV EMUL
INTRAVENOUS | Status: AC
  Filled 2020-01-22: qty 50

## 2020-01-22 MED ORDER — PROPOFOL 500 MG/50ML IV EMUL
INTRAVENOUS | Status: DC | PRN
  Administered 2020-01-22: 15 ug/kg/min via INTRAVENOUS

## 2020-01-22 MED ORDER — GLYCOPYRROLATE 0.2 MG/ML IJ SOLN
INTRAMUSCULAR | Status: AC
  Filled 2020-01-22: qty 1

## 2020-01-22 MED ORDER — FENTANYL CITRATE (PF) 100 MCG/2ML IJ SOLN
INTRAMUSCULAR | Status: DC | PRN
  Administered 2020-01-22 (×3): 25 ug via INTRAVENOUS

## 2020-01-22 MED ORDER — GLYCOPYRROLATE 0.2 MG/ML IJ SOLN
INTRAMUSCULAR | Status: DC | PRN
  Administered 2020-01-22: .2 mg via INTRAVENOUS

## 2020-01-22 MED ORDER — MIDAZOLAM HCL 2 MG/2ML IJ SOLN
INTRAMUSCULAR | Status: DC | PRN
  Administered 2020-01-22: 2 mg via INTRAVENOUS

## 2020-01-22 MED ORDER — BUPIVACAINE LIPOSOME 1.3 % IJ SUSP
INTRAMUSCULAR | Status: DC | PRN
  Administered 2020-01-22: 20 mL

## 2020-01-22 MED ORDER — ONABOTULINUMTOXINA 100 UNITS IJ SOLR
50.0000 [IU] | Freq: Once | INTRAMUSCULAR | Status: DC
  Filled 2020-01-22: qty 100

## 2020-01-22 MED ORDER — FENTANYL CITRATE (PF) 100 MCG/2ML IJ SOLN: 25.0000 ug | INTRAMUSCULAR | Status: DC | PRN

## 2020-01-22 MED ORDER — DEXAMETHASONE SODIUM PHOSPHATE 10 MG/ML IJ SOLN
INTRAMUSCULAR | Status: DC | PRN
  Administered 2020-01-22: 10 mg via INTRAVENOUS

## 2020-01-22 MED ORDER — LIDOCAINE HCL (PF) 2 % IJ SOLN
INTRAMUSCULAR | Status: AC
  Filled 2020-01-22: qty 10

## 2020-01-22 MED ORDER — MIDAZOLAM HCL 2 MG/2ML IJ SOLN
INTRAMUSCULAR | Status: AC
  Filled 2020-01-22: qty 2

## 2020-01-22 MED ORDER — ONABOTULINUMTOXINA 100 UNITS IJ SOLR
INTRAMUSCULAR | Status: DC | PRN
  Administered 2020-01-22: 50 [IU] via INTRAMUSCULAR

## 2020-01-22 MED ORDER — DEXAMETHASONE SODIUM PHOSPHATE 10 MG/ML IJ SOLN
INTRAMUSCULAR | Status: AC
  Filled 2020-01-22: qty 1

## 2020-01-22 MED ORDER — PROPOFOL 10 MG/ML IV BOLUS
INTRAVENOUS | Status: DC | PRN
  Administered 2020-01-22: 100 mg via INTRAVENOUS
  Administered 2020-01-22: 30 mg via INTRAVENOUS

## 2020-01-22 SURGICAL SUPPLY — 29 items
BLADE SURG 15 STRL LF DISP TIS (BLADE) ×1 IMPLANT
BLADE SURG 15 STRL SS (BLADE) ×3
BRIEF STRETCH MATERNITY 2XLG (MISCELLANEOUS) ×1 IMPLANT
CANISTER SUCT 1200ML W/VALVE (MISCELLANEOUS) ×3 IMPLANT
CANISTER SUCT 3000ML PPV (MISCELLANEOUS) ×3 IMPLANT
COVER WAND RF STERILE (DRAPES) ×3 IMPLANT
DRAPE 3/4 80X56 (DRAPES) ×3 IMPLANT
DRAPE LEGGINS SURG 28X43 STRL (DRAPES) ×3 IMPLANT
DRAPE UNDER BUTTOCK W/FLU (DRAPES) ×3 IMPLANT
ELECT CAUTERY BLADE 6.4 (BLADE) ×3 IMPLANT
ELECT REM PT RETURN 9FT ADLT (ELECTROSURGICAL)
ELECTRODE REM PT RTRN 9FT ADLT (ELECTROSURGICAL) ×1 IMPLANT
GLOVE BIO SURGEON STRL SZ7 (GLOVE) ×3 IMPLANT
GOWN STRL REUS W/ TWL LRG LVL3 (GOWN DISPOSABLE) ×2 IMPLANT
GOWN STRL REUS W/TWL LRG LVL3 (GOWN DISPOSABLE) ×6
KIT TURNOVER CYSTO (KITS) ×3 IMPLANT
NDL HPO THNWL 1X22GA REG BVL (NEEDLE) ×2 IMPLANT
NEEDLE HYPO 22GX1.5 SAFETY (NEEDLE) ×3 IMPLANT
NEEDLE SAFETY 22GX1 (NEEDLE) ×6
NS IRRIG 1000ML POUR BTL (IV SOLUTION) ×3 IMPLANT
PACK BASIN MINOR ARMC (MISCELLANEOUS) ×3 IMPLANT
PAD OB MATERNITY 4.3X12.25 (PERSONAL CARE ITEMS) ×3 IMPLANT
PAD PREP 24X41 OB/GYN DISP (PERSONAL CARE ITEMS) ×3 IMPLANT
SOL PREP PVP 2OZ (MISCELLANEOUS) ×3
SOLUTION PREP PVP 2OZ (MISCELLANEOUS) ×1 IMPLANT
SPONGE LAP 18X18 RF (DISPOSABLE) ×3 IMPLANT
SURGILUBE 2OZ TUBE FLIPTOP (MISCELLANEOUS) ×3 IMPLANT
SYR 10ML LL (SYRINGE) ×3 IMPLANT
SYR 20ML LL LF (SYRINGE) ×3 IMPLANT

## 2020-01-22 NOTE — OR Nursing (Signed)
Patient instructed on sitz bath and given kit.

## 2020-01-22 NOTE — Anesthesia Postprocedure Evaluation (Signed)
Anesthesia Post Note  Patient: Regina [name]  Procedure(s) Performed: EXAM UNDER ANESTHESIA (N/A ) Chemical SPHINCTEROTOMY w/Botox (N/A )  Patient location during evaluation: PACU Anesthesia Type: General Level of consciousness: awake and alert and oriented Pain management: pain level controlled Vital Signs Assessment: post-procedure vital signs reviewed and stable Respiratory status: spontaneous breathing, nonlabored ventilation and respiratory function stable Cardiovascular status: blood pressure returned to baseline and stable Postop Assessment: no signs of nausea or vomiting Anesthetic complications: no     Last Vitals:  Vitals:   01/22/20 1327 01/22/20 1355  BP: 106/74 109/71  Pulse: 74 88  Resp: 16 14  Temp: 36.5 C   SpO2: 100% 100%    Last Pain:  Vitals:   01/22/20 1355  TempSrc:   PainSc: 3                  [name]

## 2020-01-22 NOTE — Anesthesia Procedure Notes (Signed)
Date/Time: 01/22/2020 11:47 AM Performed by: Stormy Fabian, CRNA Pre-anesthesia Checklist: Patient identified, Emergency Drugs available, Suction available and Patient being monitored Patient Re-evaluated:Patient Re-evaluated prior to induction Oxygen Delivery Method: Nasal cannula Induction Type: IV induction Dental Injury: Teeth and Oropharynx as per pre-operative assessment  Comments: Nasal cannula with etCO2 monitoring

## 2020-01-22 NOTE — Anesthesia Preprocedure Evaluation (Signed)
Anesthesia Evaluation  Patient identified by MRN, date of birth, ID band Patient awake    Reviewed: Allergy & Precautions, NPO status , Patient's Chart, lab work & pertinent test results  History of Anesthesia Complications (+) PONV and history of anesthetic complications  Airway Mallampati: II  TM Distance: >3 FB Neck ROM: Full    Dental no notable dental hx.    Pulmonary neg sleep apnea, neg COPD,    breath sounds clear to auscultation- rhonchi (-) wheezing      Cardiovascular Exercise Tolerance: Good (-) hypertension(-) CAD, (-) Past MI, (-) Cardiac Stents and (-) CABG  Rhythm:Regular Rate:Normal - Systolic murmurs and - Diastolic murmurs    Neuro/Psych  Headaches, neg Seizures PSYCHIATRIC DISORDERS (ADHD)    GI/Hepatic Neg liver ROS, GERD  ,  Endo/Other  negative endocrine ROSneg diabetes  Renal/GU negative Renal ROS     Musculoskeletal negative musculoskeletal ROS (+)   Abdominal (+) - obese,   Peds  Hematology  (+) anemia ,   Anesthesia Other Findings Past Medical History: No date: ADHD (attention deficit hyperactivity disorder) No date: Anemia No date: Arthritis No date: GERD (gastroesophageal reflux disease) No date: Migraine headache     Comment:  about 1x/week since starting Ajovy (post concussion               syndome) No date: PONV (postoperative nausea and vomiting) No date: Scoliosis   Reproductive/Obstetrics negative OB ROS                             Anesthesia Physical Anesthesia Plan  ASA: II  Anesthesia Plan: General   Post-op Pain Management:    Induction: Intravenous  PONV Risk Score and Plan: 3 and Propofol infusion  Airway Management Planned: Natural Airway  Additional Equipment:   Intra-op Plan:   Post-operative Plan:   Informed Consent: I have reviewed the patients History and Physical, chart, labs and discussed the procedure including the  risks, benefits and alternatives for the proposed anesthesia with the patient or authorized representative who has indicated his/her understanding and acceptance.     Dental advisory given  Plan Discussed with: CRNA and Anesthesiologist  Anesthesia Plan Comments:         Anesthesia Quick Evaluation

## 2020-01-22 NOTE — Op Note (Signed)
  01/22/2020  12:10 PM  PATIENT:  Regina [name]  29 y.o. female  PRE-OPERATIVE DIAGNOSIS:  Anal fissure  POST-OPERATIVE DIAGNOSIS:  Same  PROCEDURE:   1. Exam under Anesthesia 2. Chemical lateral sphinterotomy with 50 IU of botox  SURGEON:  Surgeon(s) and Role:    * Crosby Bevan F, MD - Primary  FINDINGS: POST MIDLINE SMALL FISSURE  ANESTHESIA: IV General  EBL: minimal   DICTATION:  Patient was EXplained about the  Procedure in detail. Risks, benefits, possible complications and a consent was obtained. The patient taken to the operating room and placed in the lithotomy position and she was prepped and draped in the usual sterile fashion.  Exam under anesthesia revealed a small posterior midline fissure.  I was able to palpate the sphincter and injected 50 international units of Botox in the lateral portion. There was no evidence of any bleed.  Liposomal Marcaine was injected about the perianal area for postoperative analgesia Needle and laparotomy counts were correct and there were no immediate complications  Leafy Ro, MD

## 2020-01-22 NOTE — Discharge Instructions (Addendum)
AMBULATORY SURGERY  DISCHARGE INSTRUCTIONS   1) The drugs that you were given will stay in your system until tomorrow so for the next 24 hours you should not:  A) Drive an automobile B) Make any legal decisions C) Drink any alcoholic beverage   2) You may resume regular meals tomorrow.  Today it is better to start with liquids and gradually work up to solid foods.  You may eat anything you prefer, but it is better to start with liquids, then soup and crackers, and gradually work up to solid foods.   3) Please notify your doctor immediately if you have any unusual bleeding, trouble breathing, redness and pain at the surgery site, drainage, fever, or pain not relieved by medication. 4)   5) Your post-operative visit with Dr.                                     is: Date:                        Time:    Please call to schedule your post-operative visit.  6) Additional Instructions:     Anal Fistulotomy  Anal fistulotomy is a surgical procedure to open and drain an anal fistula. An anal fistula is an abnormal tunnel that develops between the bowel and the skin near the outside of the anus, where stool (feces) comes out. During this procedure, the fistula is opened up and the contents are drained to promote healing. Tell a health care provider about:  Any allergies you have.  All medicines you are taking, including vitamins, herbs, eye drops, creams, and over-the-counter medicines.  Any problems you or family members have had with anesthetic medicines.  Any blood disorders you have.  Any surgeries you have had.  Any medical conditions you have.  Any problems you have controlling your bowel movements (incontinence).  Whether you are pregnant or may be pregnant. What are the risks? Generally, this is a safe procedure. However, problems may occur, including:  Infection.  Bleeding.  Allergic reactions to medicines or dyes.  Damage to nearby structures or  organs.  Incontinence or leaking of stool.  Being unable to empty your bladder (urinary retention).  Needing more surgery, if the fistula returns. What happens before the procedure? Staying hydrated Follow instructions from your health care provider about hydration, which may include:  Up to 2 hours before the procedure - you may continue to drink clear liquids, such as water, clear fruit juice, black coffee, and plain tea. Eating and drinking restrictions Follow instructions from your health care provider about eating and drinking, which may include:  8 hours before the procedure - stop eating heavy meals or foods, such as meat, fried foods, or fatty foods.  6 hours before the procedure - stop eating light meals or foods, such as toast or cereal.  6 hours before the procedure - stop drinking milk or drinks that contain milk.  2 hours before the procedure - stop drinking clear liquids. Medicines Ask your health care provider about:  Changing or stopping your regular medicines. This is especially important if you are taking diabetes medicines or blood thinners.  Taking medicines such as aspirin and ibuprofen. These medicines can thin your blood. Do not take these medicines unless your health care provider tells you to take them.  Taking over-the-counter medicines, vitamins, herbs, and supplements. Surgery  safety Ask your health care provider:  How your surgery site will be marked.  What steps will be taken to help prevent infection. These may include: ? Removing hair at the surgery site. ? Washing skin with a germ-killing soap. ? Taking antibiotic medicine. General instructions  Do not use any products that contain nicotine or tobacco for at least 4 weeks before the procedure. These products include cigarettes, e-cigarettes, and chewing tobacco. If you need help quitting, ask your health care provider.  You may have an exam or testing.  You may have a blood or urine sample  taken.  You may be told to take a medicine that helps you have a bowel movement (laxative) or use an enema to clean your bowels before surgery.  Plan to have someone take you home from the hospital or clinic.  Plan to have a responsible adult care for you for at least 24 hours after you leave the hospital or clinic. This is important. What happens during the procedure?  An IV will be inserted into one of your veins.  You will be given one or more of the following: ? A medicine to help you relax (sedative). ? A medicine to numb the area (local anesthetic). ? A medicine to make you fall asleep (general anesthetic).  Your surgeon will locate the internal opening of your fistula.  An incision will be made in the fistula opening. The incision may extend into the muscles around the anus (sphincter muscles).  The fistula will be cut open and drained.  Gauze bandages (dressings) may be placed inside of the fistula. The procedure may vary among health care providers and hospitals. What happens after the procedure?   Your blood pressure, heart rate, breathing rate, and blood oxygen level will be monitored until you leave the hospital or clinic.  Do not drive for 24 hours if you were given a sedative during your procedure.  You may continue to receive fluids and medicines through an IV.  You may have some bleeding from your incision. You may have to wear a pad to absorb blood. Summary  Anal fistulotomy is a surgical procedure to open and drain an anal fistula, which is an abnormal tunnel that develops between the bowel and the skin near the outside of the anus.  Before the procedure, tell your health care provider about any medical problems you have or have had, including problems controlling bowel movements.  You will be told what to eat and drink before the procedure, and what medicines to change or stop. Follow these instructions carefully.  This is a safe procedure. However,  problems may occur, including incontinence or leaking of stool and being unable to empty your bladder (urinary retention).  You may have some bleeding from your incision after the procedure. You may have to wear a pad to absorb blood. This information is not intended to replace advice given to you by your health care provider. Make sure you discuss any questions you have with your health care provider. Document Revised: 04/09/2019 Document Reviewed: 04/09/2019 Elsevier Patient Education  [place].

## 2020-01-22 NOTE — Transfer of Care (Signed)
Immediate Anesthesia Transfer of Care Note  Patient: Regina [name]  Procedure(s) Performed: Procedure(s): EXAM UNDER ANESTHESIA (N/A) Chemical SPHINCTEROTOMY w/Botox (N/A)  Patient Location: PACU  Anesthesia Type:General  Level of Consciousness: sedated  Airway & Oxygen Therapy: Patient Spontanous Breathing and Patient connected to face mask oxygen  Post-op Assessment: Report given to RN and Post -op Vital signs reviewed and stable  Post vital signs: Reviewed and stable  Last Vitals:  Vitals:   01/22/20 1120 01/22/20 1215  BP: 108/76 113/79  Pulse: 86   Resp: 14 16  Temp: 37 C (!) 36 C  SpO2: 95%     Complications: No apparent anesthesia complications

## 2020-01-22 NOTE — Interval H&P Note (Signed)
History and Physical Interval Note:  01/22/2020 11:09 AM  Regina [name]  has presented today for surgery, with the diagnosis of Anal fisure.  The various methods of treatment have been discussed with the patient and family. After consideration of risks, benefits and other options for treatment, the patient has consented to  Procedure(s): EXAM UNDER ANESTHESIA (N/A) Chemical SPHINCTEROTOMY w/Botox (N/A) as a surgical intervention.  The patient's history has been reviewed, patient examined, no change in status, stable for surgery.  I have reviewed the patient's chart and labs.  Questions were answered to the patient's satisfaction.     Maniyah Moller F Thetis Schwimmer

## 2020-02-05 ENCOUNTER — Other Ambulatory Visit: Payer: Self-pay | Admitting: Gastroenterology

## 2020-02-05 ENCOUNTER — Ambulatory Visit: Payer: Medicaid Other | Admitting: Gastroenterology

## 2020-02-06 ENCOUNTER — Other Ambulatory Visit: Payer: Self-pay

## 2020-02-06 ENCOUNTER — Ambulatory Visit: Payer: Medicaid Other | Admitting: Gastroenterology

## 2020-02-06 VITALS — BP 109/73 | HR 76 | Temp 98.2°F | Ht 69.0 in | Wt 184.2 lb

## 2020-02-06 DIAGNOSIS — K581 Irritable bowel syndrome with constipation: Secondary | ICD-10-CM

## 2020-02-06 DIAGNOSIS — K602 Anal fissure, unspecified: Secondary | ICD-10-CM | POA: Diagnosis not present

## 2020-02-06 DIAGNOSIS — K625 Hemorrhage of anus and rectum: Secondary | ICD-10-CM

## 2020-02-06 MED ORDER — LACTULOSE 10 GM/15ML PO SOLN: 20.0000 g | Freq: Two times a day (BID) | ORAL | 2 refills | Status: DC

## 2020-02-06 NOTE — Progress Notes (Signed)
Jonathon Bellows MD, MRCP(U.K) [address]  [place]  [place], [place] [address]  Main: [phone]  Fax: [phone]   Primary Care Physician: Tamsen Roers, MD  Primary Gastroenterologist:  Dr. Jonathon Bellows   Follow-up for internal hemorrhoids  HPI: Regina [name] is a 29 y.o. female    Summary of history :  Regina [name] has been followed for irritable bowel syndrome with constipation.Ongoing her whole life. [place] mother had colon cancer. Previously used to take [facility] powders and has stopped.She has tried other agents in the past including Amitiza/Linzess/Motegrity which have not worked.MiraLAX did not work.  10/18/2018 : TSH-normal 10/23/18: colonoscopy -normal She has undergone band ligation of internal hemorrhoids by Dr. Marius Ditch. Last was seen on 12/03/2019  Interval history 12/17/2019-02/06/2020  She had ongoing bleeding with bowel movements despite not having any constipation.  Felt likely secondary to anal fissure and been treated did with nifedipine cream which she did not respond to.  Plan was for treatment with Botox secondary to hemorrhoids.  Has undergone band ligation by Dr. Marius Ditch.  Seen by Dr Dahlia Byes and underwent examination under anesthesia on 01/22/2020.  Posterior midline fissure was noted.  Botox was injected.    Since then she states that she has some minimal rectal bleeding.  Still has perianal itching.  Using a sitz bath.  Has a follow-up with Dr. Dahlia Byes.   Current Outpatient Medications  Medication Sig Dispense Refill  . Charcoal Activated (ACTIVATED CHARCOAL PO) Take 1 tablet by mouth daily as needed (Stomach discomfort).    Marland Kitchen dicyclomine (BENTYL) 10 MG capsule TAKE 1 CAPSULE (10 MG TOTAL) BY MOUTH 4 (FOUR) TIMES DAILY - BEFORE MEALS AND AT BEDTIME. 120 capsule 2  . fluticasone (FLONASE) 50 MCG/ACT nasal spray Place 2 sprays into both nostrils daily.     . Fremanezumab-vfrm (AJOVY) 225 MG/1.5ML SOAJ Inject 225 mg into the skin every 30 (thirty) days.     Marland Kitchen  HYDROcodone-acetaminophen (NORCO/VICODIN) 5-325 MG tablet Take 1 tablet by mouth every 6 (six) hours as needed for moderate pain. 12 tablet 0  . omeprazole (PRILOSEC) 40 MG capsule TAKE 1 CAPSULE BY MOUTH EVERY DAY 90 capsule 1  . PRESCRIPTION MEDICATION Place 1 application rectally in the morning and at bedtime. nifedipine ointment 30 mg    . Probiotic Product (PROBIOTIC PO) Take 1 tablet by mouth daily.     . rizatriptan (MAXALT-MLT) 10 MG disintegrating tablet Take 10 mg by mouth as needed for migraine.     . trolamine salicylate (ASPERCREME) 10 % cream Apply 1 application topically daily as needed (on feet).     No current facility-administered medications for this visit.    Allergies as of 02/06/2020  . (No Known Allergies)    ROS:  General: Negative for anorexia, weight loss, fever, chills, fatigue, weakness. ENT: Negative for hoarseness, difficulty swallowing , nasal congestion. CV: Negative for chest pain, angina, palpitations, dyspnea on exertion, peripheral edema.  Respiratory: Negative for dyspnea at rest, dyspnea on exertion, cough, sputum, wheezing.  GI: See history of present illness. GU:  Negative for dysuria, hematuria, urinary incontinence, urinary frequency, nocturnal urination.  Endo: Negative for unusual weight change.    Physical Examination:   There were no vitals taken for this visit.  General: Well-nourished, well-developed in no acute distress.  Eyes: No icterus. Conjunctivae pink. Neuro: Alert and oriented x 3.  Grossly intact. Skin: Warm and dry, no jaundice.   Psych: Alert and cooperative, normal mood and affect.   Imaging Studies:  No results found.  Assessment and Plan:   Regina [name] is a 29 y.o. y/o female with a longstanding history of irritable bowel syndrome with constipation.  Hemorrhoids treated by Dr. Allegra Lai with banding in the past.  Continue to have bleeding and abdominal seen by surgery and underwent Botox injection for posterior  fissure.  Failed nifedipine therapy.  I suggested to continue using MiraLAX twice a day she still has some constipation will provide her samples of lactulose which she can titrate up to 20 cc 3 times a day.  I have advised her to call me if it works to give her a prescription and if it does not work also to contact me to further modify the dosage.  Discussed about usage of sitz bath, perianal hygiene, avoid excessive rubbing of her perianal daily after a bowel movement.  Usage of a shower to wash herself after bowel movement and pat dry with a cotton towel to avoid any irritation.    Dr Wyline Mood  MD,MRCP [facility]) Follow up in as needed

## 2020-02-06 NOTE — Addendum Note (Signed)
Addended by: Daisy Blossom on: 02/06/2020 09:31 AM   Modules accepted: Orders

## 2020-02-13 ENCOUNTER — Ambulatory Visit (INDEPENDENT_AMBULATORY_CARE_PROVIDER_SITE_OTHER): Payer: Medicaid Other | Admitting: Surgery

## 2020-02-13 ENCOUNTER — Other Ambulatory Visit: Payer: Self-pay

## 2020-02-13 ENCOUNTER — Encounter: Payer: Self-pay | Admitting: Surgery

## 2020-02-13 VITALS — BP 111/74 | HR 67 | Temp 97.9°F | Ht 69.0 in | Wt 178.0 lb

## 2020-02-13 DIAGNOSIS — K602 Anal fissure, unspecified: Secondary | ICD-10-CM | POA: Diagnosis not present

## 2020-02-13 NOTE — Patient Instructions (Addendum)
Stop using the Lactulose  We want you to use 1-Imodium 2 mg tablet 15-20 minutes before you eat. Try to find out what types of foods irritate you more and avoid these.   Make sure you are drinking plenty of fluids.   After a bowel movement clean yourself with a wet wipe and dry the area and use Desitin/Baby Rash cream as a barrier cream.   Continue to do sitz baths, try doing this with warm water only and no soap.   Follow up here next week.

## 2020-02-15 NOTE — Progress Notes (Signed)
Outpatient Surgical Follow Up  02/15/2020  Regina [name] is an 29 y.o. female.   Chief Complaint  Patient presents with  . Routine Post Op    HPI: 29 year old female status post Botox sphincterotomy.  She comes in for follow-up she reports some loose bowel movement and some incontinence.  No fevers no chills.  She does report some perianal pain and some irritation.  No bleeding.  Past Medical History:  Diagnosis Date  . ADHD (attention deficit hyperactivity disorder)   . Anemia   . Arthritis   . GERD (gastroesophageal reflux disease)   . Migraine headache    about 1x/week since starting Ajovy (post concussion syndome)  . PONV (postoperative nausea and vomiting)   . Scoliosis     Past Surgical History:  Procedure Laterality Date  . BREAST SURGERY Right    Cyst removal  . COLONOSCOPY WITH PROPOFOL N/A 10/23/2018   Procedure: COLONOSCOPY WITH PROPOFOL;  Surgeon: Jonathon Bellows, MD;  Location: [facility] ENDOSCOPY;  Service: Gastroenterology;  Laterality: N/A;  . SPHINCTEROTOMY N/A 01/22/2020   Procedure: Chemical SPHINCTEROTOMY w/Botox;  Surgeon: Jules Husbands, MD;  Location: [facility] ORS;  Service: General;  Laterality: N/A;  . TONSILLECTOMY Bilateral 10/09/2019   Procedure: TONSILLECTOMY;  Surgeon: Clyde Canterbury, MD;  Location: [place];  Service: ENT;  Laterality: Bilateral;    No family history on file.  Social History:  reports that she has never smoked. She has never used smokeless tobacco. She reports previous drug use. Drug: Cocaine. She reports that she does not drink alcohol.  Allergies: No Known Allergies  Medications reviewed.    ROS Full ROS performed and is otherwise negative other than what is stated in HPI   BP 111/74   Pulse 67   Temp 97.9 F (36.6 C)   Ht 5\' 9"  (1.753 m)   Wt 178 lb (80.7 kg)   SpO2 98%   BMI 26.29 kg/m   Physical Exam Vitals and nursing note reviewed. Exam conducted with a chaperone present.  Constitutional:      General:  She is not in acute distress.    Appearance: Normal appearance. She is normal weight.  Eyes:     General: No scleral icterus.       Left eye: No discharge.  Cardiovascular:     Rate and Rhythm: Normal rate and regular rhythm.  Pulmonary:     Effort: Pulmonary effort is normal. No respiratory distress.     Breath sounds: Normal breath sounds. No stridor. No wheezing.  Abdominal:     General: Abdomen is flat. There is no distension.     Palpations: There is no mass.     Tenderness: There is no abdominal tenderness. There is no guarding or rebound.     Hernia: No hernia is present.  Genitourinary:    Comments: Evidence of posterior midline fissure.  There is no evidence of any necrotizing infection or perianal sepsis. Musculoskeletal:     Cervical back: Normal range of motion and neck supple.  Skin:    General: Skin is warm and dry.     Capillary Refill: Capillary refill takes less than 2 seconds.  Neurological:     General: No focal deficit present.     Mental Status: She is alert and oriented to person, place, and time.  Psychiatric:        Mood and Affect: Mood normal.        Behavior: Behavior normal.        Thought  Content: Thought content normal.        Judgment: Judgment normal.    Assessment/Plan: 29 year old female status post buttock sphincterotomy now with some diarrhea and some mild incontinence.  Discussed with the patient in detail that we will need to control her diarrhea first and maybe add some bulk to her stool.  We will start Imodium as well as fiber.  I will see her back next week.  Continue sitz bath's for now and may hold off on the nifedipine cream    Greater than 50% of the 15 minutes  visit was spent in counseling/coordination of care   Sterling Big, MD [facility]

## 2020-02-19 ENCOUNTER — Telehealth: Payer: Self-pay | Admitting: Surgery

## 2020-02-19 NOTE — Telephone Encounter (Signed)
Patient is calling and has some questions about some medication and is asking if one of the nurses would give her a call. Please call patient and advise.

## 2020-02-19 NOTE — Telephone Encounter (Signed)
Patient states that she has stopped taking the Lactulose. She says that when she takes the Imodium it makes her side hurt. She is currently taking Miralax twice daily and is now only having a bowel movement 1-2 times a day. I let he know that this was more close to normal and if she stayed with 1-2 times a day she did not need to take the Imodium. This was only if she was having 4-6 bowel movements a day like previously. She is amendable to this.

## 2020-02-20 ENCOUNTER — Other Ambulatory Visit: Payer: Self-pay

## 2020-02-20 ENCOUNTER — Other Ambulatory Visit: Payer: Medicaid Other

## 2020-02-20 ENCOUNTER — Encounter: Payer: Self-pay | Admitting: Surgery

## 2020-02-20 ENCOUNTER — Ambulatory Visit (INDEPENDENT_AMBULATORY_CARE_PROVIDER_SITE_OTHER): Payer: Self-pay | Admitting: Surgery

## 2020-02-20 VITALS — BP 113/74 | HR 84 | Temp 97.7°F | Ht 69.0 in | Wt 180.0 lb

## 2020-02-20 DIAGNOSIS — Z09 Encounter for follow-up examination after completed treatment for conditions other than malignant neoplasm: Secondary | ICD-10-CM

## 2020-02-20 MED ORDER — SULFAMETHOXAZOLE-TRIMETHOPRIM 400-80 MG PO TABS: 1.0000 | ORAL_TABLET | Freq: Two times a day (BID) | ORAL | 0 refills | Status: AC

## 2020-02-20 NOTE — Patient Instructions (Addendum)
Per Dr Everlene Farrier, HOLD [facility] until he says you may resume.   Please buy over the counter : pepto-bismol 2caps or 58ml by mouth every as needed. Max dose 16caps or 69ml/24h for up to 2 day; Alternate; 4 caps or 33ml by mouth every 1 hours as needed.   Pick up your prescription at your pharmacy.   See your appointment below.

## 2020-02-21 NOTE — Progress Notes (Signed)
Regina [name] is 29 year old following for Botox injection.  She continues to have diarrhea and some incontinence.  She continues to take MiraLAX twice a day.  Had an extensive discussion with the patient that we need to control her diarrhea first in order to achieve good control.  PE NAD Abd: soft, nt Rectal: no masses, no irritation, post midline fissure small  A/P HOld any miralax May do Pepto bismol for diarrhea RTC 1 week

## 2020-02-27 ENCOUNTER — Encounter: Payer: Self-pay | Admitting: Surgery

## 2020-02-27 ENCOUNTER — Telehealth: Payer: Self-pay

## 2020-02-27 ENCOUNTER — Ambulatory Visit (INDEPENDENT_AMBULATORY_CARE_PROVIDER_SITE_OTHER): Payer: Medicaid Other | Admitting: Surgery

## 2020-02-27 ENCOUNTER — Other Ambulatory Visit
Admission: RE | Admit: 2020-02-27 | Discharge: 2020-02-27 | Disposition: A | Discharge status: Home / Self Care | Payer: Medicaid Other | Attending: Surgery | Admitting: Surgery

## 2020-02-27 ENCOUNTER — Other Ambulatory Visit: Payer: Self-pay

## 2020-02-27 VITALS — BP 111/77 | HR 72 | Temp 97.7°F | Resp 12 | Ht 69.0 in | Wt 179.4 lb

## 2020-02-27 DIAGNOSIS — K2100 Gastro-esophageal reflux disease with esophagitis, without bleeding: Secondary | ICD-10-CM

## 2020-02-27 DIAGNOSIS — Z09 Encounter for follow-up examination after completed treatment for conditions other than malignant neoplasm: Secondary | ICD-10-CM

## 2020-02-27 LAB — CBC WITH DIFFERENTIAL/PLATELET
Abs Immature Granulocytes: 0.01 10*3/uL (ref 0.00–0.07)
Basophils Absolute: 0.1 10*3/uL (ref 0.0–0.1)
Basophils Relative: 2 %
Eosinophils Absolute: 0.1 10*3/uL (ref 0.0–0.5)
Eosinophils Relative: 2 %
HCT: 40.2 % (ref 36.0–46.0)
Hemoglobin: 12.6 g/dL (ref 12.0–15.0)
Immature Granulocytes: 0 %
Lymphocytes Relative: 37 %
Lymphs Abs: 2.4 10*3/uL (ref 0.7–4.0)
MCH: 26.6 pg (ref 26.0–34.0)
MCHC: 31.3 g/dL (ref 30.0–36.0)
MCV: 84.8 fL (ref 80.0–100.0)
Monocytes Absolute: 0.7 10*3/uL (ref 0.1–1.0)
Monocytes Relative: 11 %
Neutro Abs: 3.1 10*3/uL (ref 1.7–7.7)
Neutrophils Relative %: 48 %
Platelets: 310 10*3/uL (ref 150–400)
RBC: 4.74 MIL/uL (ref 3.87–5.11)
RDW: 14.8 % (ref 11.5–15.5)
WBC: 6.4 10*3/uL (ref 4.0–10.5)
nRBC: 0 % (ref 0.0–0.2)

## 2020-02-27 MED ORDER — OMEPRAZOLE 20 MG PO CPDR: 20.0000 mg | DELAYED_RELEASE_CAPSULE | Freq: Two times a day (BID) | ORAL | 1 refills | Status: DC

## 2020-02-27 MED ORDER — NYSTATIN 100000 UNIT/GM EX POWD: 1.0000 "application " | Freq: Two times a day (BID) | CUTANEOUS | 0 refills | Status: DC

## 2020-02-27 MED ORDER — NYSTATIN 100000 UNIT/GM EX POWD: 1.0000 "application " | Freq: Two times a day (BID) | CUTANEOUS | 1 refills | Status: AC

## 2020-02-27 NOTE — Patient Instructions (Addendum)
Continue to do sitz baths, pat dry yourself very well and put baby powder on yourself.   You will need to get lab work done so please go to the [facility] to get these done.   Pick up your medication at your local pharmacy.   We will see you in 2 weeks. Please see your appointment below.

## 2020-02-27 NOTE — Progress Notes (Signed)
Outpatient Surgical Follow Up  02/27/2020  Regina [name] is an 29 y.o. female.   Chief Complaint  Patient presents with  . Routine Post Op    Botox Injection Exam under Anesthesia 01/22/20 - Dr Everlene Farrier    HPI: Regina [name] is a 29 year old female following after chemical sphincterotomy.  She has decreased the MiraLAX intake with significant improvement in diarrhea now has formed stool.  More recently over the last couple of days have noticed black stool.  No fevers no chills.  She does report some abdominal pain, mild moderate , intermittent , associated heartburn.  She also reports some epigastric pain that is mild but is sharp in nature.  No fevers no chills.  There is some anal discomfort with itching and mild pain but this is improving. I personally reviewed the recent colonoscopy performed by Dr. Tobi Bastos last year showing no evidence of any polyps or ulcerations to explain the potential GI bleed.  Past Medical History:  Diagnosis Date  . ADHD (attention deficit hyperactivity disorder)   . Anemia   . Arthritis   . GERD (gastroesophageal reflux disease)   . Migraine headache    about 1x/week since starting Ajovy (post concussion syndome)  . PONV (postoperative nausea and vomiting)   . Scoliosis     Past Surgical History:  Procedure Laterality Date  . BREAST SURGERY Right    Cyst removal  . COLONOSCOPY WITH PROPOFOL N/A 10/23/2018   Procedure: COLONOSCOPY WITH PROPOFOL;  Surgeon: Wyline Mood, MD;  Location: [facility] ENDOSCOPY;  Service: Gastroenterology;  Laterality: N/A;  . SPHINCTEROTOMY N/A 01/22/2020   Procedure: Chemical SPHINCTEROTOMY w/Botox;  Surgeon: Leafy Ro, MD;  Location: [facility] ORS;  Service: General;  Laterality: N/A;  . TONSILLECTOMY Bilateral 10/09/2019   Procedure: TONSILLECTOMY;  Surgeon: Geanie Logan, MD;  Location: [facility];  Service: ENT;  Laterality: Bilateral;    History reviewed. No pertinent family history.  Social History:  reports that she has  never smoked. She has never used smokeless tobacco. She reports previous drug use. Drug: Cocaine. She reports that she does not drink alcohol.  Allergies: No Known Allergies  Medications reviewed.    ROS Full ROS performed and is otherwise negative other than what is stated in HPI   BP 111/77   Pulse 72   Temp 97.7 F (36.5 C)   Resp 12   Ht 5\' 9"  (1.753 m)   Wt 179 lb 6.4 oz (81.4 kg)   SpO2 97%   BMI 26.49 kg/m   Physical Exam Vitals and nursing note reviewed. Exam conducted with a chaperone present.  Constitutional:      General: She is not in acute distress.    Appearance: She is normal weight.  Eyes:     General: No scleral icterus.       Right eye: No discharge.        Left eye: No discharge.  Abdominal:     General: Abdomen is flat. There is no distension.     Palpations: There is no mass.     Tenderness: There is no abdominal tenderness. There is no guarding or rebound.     Hernia: No hernia is present.  Genitourinary:    Comments: Left small internal hemorrhoid. No masses, no perineal sepsis. Small fissure. Musculoskeletal:        General: No swelling or tenderness. Normal range of motion.     Cervical back: No rigidity.  Skin:    General: Skin is warm and dry.  Capillary Refill: Capillary refill takes less than 2 seconds.  Neurological:     General: No focal deficit present.     Mental Status: She is alert and oriented to person, place, and time.  Psychiatric:        Mood and Affect: Mood normal.        Behavior: Behavior normal.        Thought Content: Thought content normal.        Judgment: Judgment normal.   Assessment/Plan: Recovering well from Botox sphincterotomy.  Incontinent has subsided after having formed stools.  Discussed importance of bowel twice daily sitz bath's.  She seems to have also moisture is within the anal area and will prescribe antibiotic powder.  No evidence of perineal sepsis.  I will check a CBC and will start on empiric  PPI given reflux symptoms.  If she drops her hemoglobin continues to have some black stools she will need another endoscopic evaluation.  Greater than 50% of the 25 minutes  visit was spent in counseling/coordination of care   Caroleen Hamman, MD [place] [facility]geon

## 2020-02-27 NOTE — Telephone Encounter (Signed)
-----   Message from Leafy Ro, MD sent at 02/27/2020  1:06 PM EDT ----- Please let her know her hb was nml ----- Message ----- From: Interface, Lab In [place] Sent: 02/27/2020  10:21 AM EDT To: Leafy Ro, MD

## 2020-02-27 NOTE — Telephone Encounter (Signed)
Notified patient as instructed, patient pleased. Discussed follow-up appointments, patient agrees  

## 2020-03-05 ENCOUNTER — Telehealth: Payer: Self-pay | Admitting: *Deleted

## 2020-03-05 NOTE — Telephone Encounter (Signed)
Patient called and stated that she is having some problems with her hemorrhoids at her rectum that Drt pabon had found, she stated that it busted lastnight and she is having sharp pain and its itchy. Patient stated that she cant take it to please call her back

## 2020-03-05 NOTE — Telephone Encounter (Signed)
Called patients, states her hemorrhoid "busted" this morning and there was blood. Per patient the drainage/blood is getting better and is less than it was. Pt is in pain. Offered pt an appt to come in this afternoon but denied due to transportation.   After speaking with patient, pt advised to continue doing what she is doing , place ice packs. We moved appt from 03/12/20 to 03/10/20. Pt voiced understanding and agreed to the above.

## 2020-03-10 ENCOUNTER — Ambulatory Visit (INDEPENDENT_AMBULATORY_CARE_PROVIDER_SITE_OTHER): Payer: Medicaid Other | Admitting: Surgery

## 2020-03-10 ENCOUNTER — Other Ambulatory Visit: Payer: Self-pay

## 2020-03-10 ENCOUNTER — Encounter: Payer: Self-pay | Admitting: Surgery

## 2020-03-10 VITALS — BP 114/73 | HR 79 | Temp 97.9°F | Resp 14 | Ht 69.0 in | Wt 180.0 lb

## 2020-03-10 DIAGNOSIS — K625 Hemorrhage of anus and rectum: Secondary | ICD-10-CM | POA: Diagnosis not present

## 2020-03-10 DIAGNOSIS — K6289 Other specified diseases of anus and rectum: Secondary | ICD-10-CM | POA: Diagnosis not present

## 2020-03-10 NOTE — Patient Instructions (Signed)
We will contact Dr Johnney Killian office to have them get you set up for a Flex Sigmoidoscopy. We will see you back her in office with Dr Everlene Farrier once this is completed.  Please call us to schedule your follow up with Dr Everlene Farrier once they have scheduled your Sigmoidoscopy.   Try to keep your rectal area clean and dry. May try using a blow dryer after cleaning yourself after a bowel movement.

## 2020-03-10 NOTE — Progress Notes (Signed)
Regina [name] is status post chemical sphincterotomy.  She is doing better from c continence perspective and now reports hematochezia and significant anorectal pain. No fevers no chills.  PE NAD Abd: soft, nt Rectal: Evidence of pain rectal exam and this is limited.  There is no evidence of external or internal hemorrhoids at this time. Perianal area is moist and has some hygiene issues.   A/P persistent pain with bleeding.  I have personally reviewed the colonoscopy from a year and a half ago given that she has persistent symptoms I do think that is reasonable to refer to to GI for a flex sig to make sure there is no other areas that will need to be addressed.  From my perspective we will continue current therapy sitz bath's, appropriate hygiene. Please note that I spent at least 15 minutes in this encounter with greater than 50% spent in coordination and counseling of her care

## 2020-03-12 ENCOUNTER — Encounter: Payer: Self-pay | Admitting: Surgery

## 2020-04-04 ENCOUNTER — Other Ambulatory Visit: Payer: Self-pay

## 2020-04-04 ENCOUNTER — Emergency Department
Admission: EM | Admit: 2020-04-04 | Discharge: 2020-04-04 | Disposition: A | Discharge status: Home / Self Care | Payer: Medicaid Other | Attending: Emergency Medicine | Admitting: Emergency Medicine

## 2020-04-04 ENCOUNTER — Encounter: Payer: Self-pay | Admitting: Emergency Medicine

## 2020-04-04 DIAGNOSIS — W5311XA Bitten by rat, initial encounter: Secondary | ICD-10-CM | POA: Diagnosis not present

## 2020-04-04 DIAGNOSIS — S61451A Open bite of right hand, initial encounter: Secondary | ICD-10-CM | POA: Diagnosis present

## 2020-04-04 DIAGNOSIS — Y929 Unspecified place or not applicable: Secondary | ICD-10-CM | POA: Diagnosis not present

## 2020-04-04 DIAGNOSIS — Z23 Encounter for immunization: Secondary | ICD-10-CM | POA: Diagnosis not present

## 2020-04-04 DIAGNOSIS — Y939 Activity, unspecified: Secondary | ICD-10-CM | POA: Diagnosis not present

## 2020-04-04 DIAGNOSIS — Y999 Unspecified external cause status: Secondary | ICD-10-CM | POA: Diagnosis not present

## 2020-04-04 DIAGNOSIS — Z79899 Other long term (current) drug therapy: Secondary | ICD-10-CM | POA: Diagnosis not present

## 2020-04-04 DIAGNOSIS — F909 Attention-deficit hyperactivity disorder, unspecified type: Secondary | ICD-10-CM | POA: Diagnosis not present

## 2020-04-04 MED ORDER — PENICILLIN V POTASSIUM 500 MG PO TABS
500.0000 mg | ORAL_TABLET | Freq: Once | ORAL | Status: AC
  Administered 2020-04-04: 500 mg via ORAL
  Filled 2020-04-04: qty 1

## 2020-04-04 MED ORDER — PENICILLIN V POTASSIUM 500 MG PO TABS: 500.0000 mg | ORAL_TABLET | Freq: Four times a day (QID) | ORAL | 0 refills | Status: AC

## 2020-04-04 MED ORDER — IBUPROFEN 600 MG PO TABS
600.0000 mg | ORAL_TABLET | Freq: Once | ORAL | Status: AC
  Administered 2020-04-04: 600 mg via ORAL
  Filled 2020-04-04: qty 1

## 2020-04-04 MED ORDER — IBUPROFEN 600 MG PO TABS
600.0000 mg | ORAL_TABLET | Freq: Four times a day (QID) | ORAL | 0 refills | Status: DC | PRN
Start: 2020-04-04 — End: 2021-09-22

## 2020-04-04 MED ORDER — TETANUS-DIPHTH-ACELL PERTUSSIS 5-2.5-18.5 LF-MCG/0.5 IM SUSP
0.5000 mL | Freq: Once | INTRAMUSCULAR | Status: AC
  Administered 2020-04-04: 0.5 mL via INTRAMUSCULAR
  Filled 2020-04-04: qty 0.5

## 2020-04-04 NOTE — ED Provider Notes (Signed)
Regina [name]   CSN: [id number] Arrival date & time: 04/04/20  1705     History Chief Complaint  Patient presents with  . rat bite    Regina [name] is a 29 y.o. female presents to the emergency department for evaluation of being bitten on the right hand second digit couple hours ago by a rat.  Patient states she was reaching for something when she saw and felt a rat bite the tip of her finger.  Her tetanus is not up-to-date.  Since the rat bite she has felt some headaches, swelling to the finger, pain in the finger and forearm.  She is also felt a headache and slight dizziness.  No chest pain or shortness of breath.  Has not had any fevers.  She admits to having little bit of nausea but no vomiting.  HPI     Past Medical History:  Diagnosis Date  . ADHD (attention deficit hyperactivity disorder)   . Anemia   . Arthritis   . GERD (gastroesophageal reflux disease)   . Migraine headache    about 1x/week since starting Ajovy (post concussion syndome)  . PONV (postoperative nausea and vomiting)   . Scoliosis     Patient Active Problem List   Diagnosis Date Noted  . Intractable migraine with aura without status migrainosus 02/14/2019  . Seizure-like activity ([place]) 09/04/2018  . Headache disorder 04/12/2018  . Post concussion syndrome 04/12/2018    Past Surgical History:  Procedure Laterality Date  . BREAST SURGERY Right    Cyst removal  . COLONOSCOPY WITH PROPOFOL N/A 10/23/2018   Procedure: COLONOSCOPY WITH PROPOFOL;  Surgeon: Jonathon Bellows, MD;  Location: [facility] ENDOSCOPY;  Service: Gastroenterology;  Laterality: N/A;  . SPHINCTEROTOMY N/A 01/22/2020   Procedure: Chemical SPHINCTEROTOMY w/Botox;  Surgeon: Jules Husbands, MD;  Location: [facility] ORS;  Service: General;  Laterality: N/A;  . TONSILLECTOMY Bilateral 10/09/2019   Procedure: TONSILLECTOMY;  Surgeon: Clyde Canterbury, MD;  Location: [place];  Service: ENT;   Laterality: Bilateral;     OB History   No obstetric history on file.     History reviewed. No pertinent family history.  Social History   Tobacco Use  . Smoking status: Never Smoker  . Smokeless tobacco: Never Used  Substance Use Topics  . Alcohol use: No  . Drug use: Not Currently    Types: Cocaine    Home Medications Prior to Admission medications   Medication Sig Start Date End Date Taking? Authorizing Provider  Charcoal Activated (ACTIVATED CHARCOAL PO) Take 1 tablet by mouth daily as needed (Stomach discomfort).    [provider]  dicyclomine (BENTYL) 10 MG capsule TAKE 1 CAPSULE (10 MG TOTAL) BY MOUTH 4 (FOUR) TIMES DAILY - BEFORE MEALS AND AT BEDTIME. 02/05/20 05/05/20  Jonathon Bellows, MD  fluticasone [facility]) 50 MCG/ACT nasal spray Place 2 sprays into both nostrils daily.  09/10/19   [provider]  Fremanezumab-vfrm (AJOVY) 225 MG/1.5ML SOAJ Inject 225 mg into the skin every 30 (thirty) days.     [provider]  ibuprofen (ADVIL) 600 MG tablet Take 1 tablet (600 mg total) by mouth every 6 (six) hours as needed for moderate pain. 04/04/20   Duanne Guess, PA-C  lactulose (CHRONULAC) 10 GM/15ML solution Take 30 mLs (20 g total) by mouth 2 (two) times daily. Patient not taking: Reported on 03/10/2020 02/06/20   Jonathon Bellows, MD  nystatin (NYSTATIN) powder Apply 1 application topically 2 (two)  times daily. 02/27/20   Pabon, Merri Ray, MD  omeprazole (PRILOSEC) 20 MG capsule Take 1 capsule (20 mg total) by mouth 2 (two) times daily before a meal. 02/27/20   Pabon, Diego F, MD  penicillin v potassium (VEETID) 500 MG tablet Take 1 tablet (500 mg total) by mouth 4 (four) times daily for 14 days. 04/04/20 04/18/20  Evon Slack, PA-C  polyethylene glycol (MIRALAX / GLYCOLAX) 17 g packet Take 17 g by mouth 2 (two) times daily.    [provider]  PRESCRIPTION MEDICATION Place 1 application rectally in the morning and at bedtime. nifedipine ointment 30  mg    [provider]  Probiotic Product (PROBIOTIC PO) Take 1 tablet by mouth daily.     [provider]  rizatriptan (MAXALT-MLT) 10 MG disintegrating tablet Take 10 mg by mouth as needed for migraine.  04/02/19   [provider]  trolamine salicylate (ASPERCREME) 10 % cream Apply 1 application topically daily as needed (on feet).    [provider]    Allergies    Patient has no known allergies.  Review of Systems   Review of Systems  Constitutional: Negative for chills, fatigue and fever.  Eyes: Negative for photophobia.  Respiratory: Negative for shortness of breath.   Cardiovascular: Negative for chest pain.  Gastrointestinal: Positive for nausea. Negative for abdominal pain and vomiting.  Genitourinary: Negative for dysuria and flank pain.  Musculoskeletal: Positive for arthralgias and myalgias. Negative for neck stiffness.  Skin: Negative for rash and wound.  Neurological: Positive for headaches.    Physical Exam Updated Vital Signs BP 127/78 (BP Location: Left Arm)   Pulse 78   Temp 98.7 F (37.1 C) (Oral)   Resp 16   Ht 5\' 4"  (1.626 m)   Wt 80.4 kg   LMP 03/07/2020   SpO2 98%   BMI 30.42 kg/m   Physical Exam Constitutional:      Appearance: She is well-developed.  HENT:     Head: Normocephalic and atraumatic.     Right Ear: Ear canal and external ear normal.     Left Ear: Ear canal and external ear normal.     Nose: Nose normal.     Mouth/Throat:     Mouth: Mucous membranes are moist.     Pharynx: No oropharyngeal exudate or posterior oropharyngeal erythema.  Eyes:     Conjunctiva/sclera: Conjunctivae normal.  Cardiovascular:     Rate and Rhythm: Normal rate.     Pulses: Normal pulses.  Pulmonary:     Effort: Pulmonary effort is normal. No respiratory distress.  Abdominal:     General: There is no distension.     Tenderness: There is no abdominal tenderness. There is no guarding.  Musculoskeletal:        General:  Normal range of motion.     Cervical back: Normal range of motion.     Comments: Right hand with no swelling warmth or erythema.  No signs of bite wounds to the right index finger.  She has full composite fist.  No tenderness along the flexor tendons.  Sensation is intact distally.  2+ cap refill.  Skin:    General: Skin is warm.     Findings: No rash.  Neurological:     General: No focal deficit present.     Mental Status: She is alert and oriented to person, place, and time. Mental status is at baseline.     Cranial Nerves: No cranial nerve deficit.  Motor: No weakness.     Coordination: Coordination normal.  Psychiatric:        Mood and Affect: Mood normal.        Behavior: Behavior normal.        Thought Content: Thought content normal.     ED Results / Procedures / Treatments   Labs (all labs ordered are listed, but only abnormal results are displayed) Labs Reviewed  URINALYSIS, COMPLETE (UACMP) WITH MICROSCOPIC  CBG MONITORING, ED  POC URINE PREG, ED    EKG None  Radiology No results found.  Procedures Procedures (including critical care time)  Medications Ordered in ED Medications  penicillin v potassium (VEETID) tablet 500 mg (500 mg Oral Given 04/04/20 2117)  Tdap (BOOSTRIX) injection 0.5 mL (0.5 mLs Intramuscular Given 04/04/20 2119)  ibuprofen (ADVIL) tablet 600 mg (600 mg Oral Given 04/04/20 2117)    ED Course  I have reviewed the triage vital signs and the nursing notes.  Pertinent labs & imaging results that were available during my care of the patient were reviewed by me and considered in my medical decision making (see chart for details).    MDM Rules/Calculators/A&P                      29 year old female with right bite to the right index finger.  Within a couple hours she is developed a headache, reports of dizziness although she is not having any dizziness at this time.  She also has a lot of pain to the right index finger, describes some  referred pain up the forearm from the index finger.  She also has mild nausea.  Patient is given ibuprofen with improvement of pain.  Her vital signs are normal, afebrile.  Exam is normal.  No signs of cellulitis or flexor tenosynovitis or compartment syndrome.  Her tetanus is updated and she is placed on prophylactic penicillin for rat bite fever.  She will take penicillin for 14 days.  She understands signs and symptoms return to the ED for. Final Clinical Impression(s) / ED Diagnoses Final diagnoses:  Rat bite, initial encounter    Rx / DC Orders ED Discharge Orders         Ordered    ibuprofen (ADVIL) 600 MG tablet  Every 6 hours PRN     04/04/20 2143    penicillin v potassium (VEETID) 500 MG tablet  4 times daily     04/04/20 2143           Ronnette Juniper 04/04/20 2202    Phineas Semen, MD 04/04/20 2330

## 2020-04-04 NOTE — ED Triage Notes (Signed)
Pt reports was bit by rat today on 2nd digit right hand.  No redness/swelling. C/o headache since. Took BC powder and it helped some per pt.  Reports shooting pains in right arm from where she was bit

## 2020-04-04 NOTE — ED Triage Notes (Signed)
Patient now complaining of being lightheaded. Acuity changed and protocols ordered.

## 2020-04-04 NOTE — Discharge Instructions (Addendum)
Please take antibiotics as prescribed.  Please complete 14-day course of antibiotic.  Take ibuprofen and/or Tylenol as needed for any body aches or headaches.  Return to the ER for any swelling, increasing pain, fevers above 101, worsening symptoms or urgent changes in health.

## 2020-04-11 ENCOUNTER — Other Ambulatory Visit: Payer: Self-pay

## 2020-04-11 DIAGNOSIS — R109 Unspecified abdominal pain: Secondary | ICD-10-CM | POA: Diagnosis present

## 2020-04-11 DIAGNOSIS — Z5321 Procedure and treatment not carried out due to patient leaving prior to being seen by health care provider: Secondary | ICD-10-CM | POA: Insufficient documentation

## 2020-04-11 LAB — COMPREHENSIVE METABOLIC PANEL
ALT: 13 U/L (ref 0–44)
AST: 16 U/L (ref 15–41)
Albumin: 4 g/dL (ref 3.5–5.0)
Alkaline Phosphatase: 57 U/L (ref 38–126)
Anion gap: 9 (ref 5–15)
BUN: 12 mg/dL (ref 6–20)
CO2: 24 mmol/L (ref 22–32)
Calcium: 8.9 mg/dL (ref 8.9–10.3)
Chloride: 107 mmol/L (ref 98–111)
Creatinine, Ser: 0.68 mg/dL (ref 0.44–1.00)
GFR calc Af Amer: >60 mL/min (ref >60)
GFR calc non Af Amer: >60 mL/min (ref >60)
Glucose, Bld: 109 mg/dL — ABNORMAL HIGH (ref 70–99)
Potassium: 3.4 mmol/L — ABNORMAL LOW (ref 3.5–5.1)
Sodium: 140 mmol/L (ref 135–145)
Total Bilirubin: 0.5 mg/dL (ref 0.3–1.2)
Total Protein: 7.7 g/dL (ref 6.5–8.1)

## 2020-04-11 LAB — URINALYSIS, COMPLETE (UACMP) WITH MICROSCOPIC
Bacteria, UA: NONE SEEN
Bilirubin Urine: NEGATIVE
Glucose, UA: NEGATIVE mg/dL
Hgb urine dipstick: NEGATIVE
Ketones, ur: NEGATIVE mg/dL
Leukocytes,Ua: NEGATIVE
Nitrite: NEGATIVE
Protein, ur: NEGATIVE mg/dL
Specific Gravity, Urine: 1.005 (ref 1.005–1.030)
pH: 7 (ref 5.0–8.0)

## 2020-04-11 LAB — CBC
HCT: 35.4 % — ABNORMAL LOW (ref 36.0–46.0)
Hemoglobin: 11.6 g/dL — ABNORMAL LOW (ref 12.0–15.0)
MCH: 27.5 pg (ref 26.0–34.0)
MCHC: 32.8 g/dL (ref 30.0–36.0)
MCV: 83.9 fL (ref 80.0–100.0)
Platelets: 321 10*3/uL (ref 150–400)
RBC: 4.22 MIL/uL (ref 3.87–5.11)
RDW: 14.4 % (ref 11.5–15.5)
WBC: 13 10*3/uL — ABNORMAL HIGH (ref 4.0–10.5)
nRBC: 0 % (ref 0.0–0.2)

## 2020-04-11 LAB — LIPASE, BLOOD: Lipase: 28 U/L (ref 11–51)

## 2020-04-11 MED ORDER — IOHEXOL 9 MG/ML PO SOLN: 500.0000 mL | Freq: Once | ORAL | Status: DC | PRN

## 2020-04-11 MED ORDER — SODIUM CHLORIDE 0.9% FLUSH: 3.0000 mL | Freq: Once | INTRAVENOUS | Status: DC

## 2020-04-11 NOTE — ED Notes (Signed)
Patient given up date regarding wait time. 

## 2020-04-11 NOTE — ED Triage Notes (Addendum)
Pt arrives via POV for c/o LLQ pain that started this morning. Pt reports no other symptoms other than a sharp pain that comes and goes in the LLQ. PT ambulatory from lobby in NAD. Pt reports hx of diverticulitis. Reports dysuria.

## 2020-04-12 ENCOUNTER — Emergency Department
Admission: EM | Admit: 2020-04-12 | Discharge: 2020-04-12 | Disposition: A | Discharge status: Home / Self Care | Payer: Medicaid Other | Attending: Emergency Medicine | Admitting: Emergency Medicine

## 2020-04-12 NOTE — ED Notes (Signed)
Unable to locate patient in waiting room or outside to move to subwait to start CT oral contrast.

## 2020-04-12 NOTE — ED Notes (Signed)
Unable to locate patient in lobby or outside at this time.

## 2020-04-12 NOTE — ED Notes (Signed)
No answer when called again to start CT contrast.

## 2020-04-16 ENCOUNTER — Ambulatory Visit (INDEPENDENT_AMBULATORY_CARE_PROVIDER_SITE_OTHER): Payer: Medicaid Other | Admitting: Gastroenterology

## 2020-04-16 ENCOUNTER — Other Ambulatory Visit: Payer: Self-pay

## 2020-04-16 ENCOUNTER — Encounter (INDEPENDENT_AMBULATORY_CARE_PROVIDER_SITE_OTHER): Payer: Self-pay

## 2020-04-16 VITALS — BP 121/82 | HR 86 | Temp 97.8°F | Ht 69.0 in | Wt 173.0 lb

## 2020-04-16 DIAGNOSIS — K30 Functional dyspepsia: Secondary | ICD-10-CM | POA: Diagnosis not present

## 2020-04-16 NOTE — Progress Notes (Signed)
Regina Bellows Regina [name], MRCP(U.K) [address]  [place]  [place], [place] [address]  Main: [phone]  Fax: [phone]   Primary Care Physician: Regina Roers, Regina [name]  Primary Gastroenterologist:  Dr. Jonathon [name]   No chief complaint on file.   HPI: Regina [name] is a 29 y.o. [name]   Summary of history :  Regina [name] has been followed for irritable bowel syndrome with constipation.Ongoing her whole life. [place] mother had colon cancer. .She has tried other agents in the past including Amitiza/Linzess which have not worked.MiraLAX did not work.  10/18/2018 : TSH-normal 10/23/18: colonoscopy -normal She has undergone band ligation of internal hemorrhoids by Dr. Marius [name]. Last was seen on 12/03/2019 01/22/2020: Posterior midline fissure noted by Dr. Dahlia [name] and injected with Botox.  Interval history 02/06/2020-04/16/2020  She says the main reason she is here today is left lower quadrant discomfort crampy.  Still has hard stools at times.  She senses a feeling of itching inside her abdomen which she feels like scratching but nothing on the outside.     Current Outpatient Medications  Medication Sig Dispense Refill  . Charcoal Activated (ACTIVATED CHARCOAL PO) Take 1 tablet by mouth daily as needed (Stomach discomfort).    Marland Kitchen dicyclomine (BENTYL) 10 MG capsule TAKE 1 CAPSULE (10 MG TOTAL) BY MOUTH 4 (FOUR) TIMES DAILY - BEFORE MEALS AND AT BEDTIME. 120 capsule 2  . fluticasone (FLONASE) 50 MCG/ACT nasal spray Place 2 sprays into both nostrils daily.     . Fremanezumab-vfrm (AJOVY) 225 MG/1.5ML SOAJ Inject 225 mg into the skin every 30 (thirty) days.     Marland Kitchen ibuprofen (ADVIL) 600 MG tablet Take 1 tablet (600 mg total) by mouth every 6 (six) hours as needed for moderate pain. 30 tablet 0  . lactulose (CHRONULAC) 10 GM/15ML solution Take 30 mLs (20 g total) by mouth 2 (two) times daily. (Patient not taking: Reported on 03/10/2020) 1800 mL 2  . nystatin (NYSTATIN) powder Apply 1 application  topically 2 (two) times daily. 60 g 1  . omeprazole (PRILOSEC) 20 MG capsule Take 1 capsule (20 mg total) by mouth 2 (two) times daily before a meal. 60 capsule 1  . penicillin v potassium (VEETID) 500 MG tablet Take 1 tablet (500 mg total) by mouth 4 (four) times daily for 14 days. 56 tablet 0  . polyethylene glycol (MIRALAX / GLYCOLAX) 17 g packet Take 17 g by mouth 2 (two) times daily.    Marland Kitchen PRESCRIPTION MEDICATION Place 1 application rectally in the morning and at bedtime. nifedipine ointment 30 mg    . Probiotic Product (PROBIOTIC PO) Take 1 tablet by mouth daily.     . rizatriptan (MAXALT-MLT) 10 MG disintegrating tablet Take 10 mg by mouth as needed for migraine.     . trolamine salicylate (ASPERCREME) 10 % cream Apply 1 application topically daily as needed (on feet).     No current facility-administered medications for this visit.    Allergies as of 04/16/2020  . (No Known Allergies)    ROS:  General: Negative for anorexia, weight loss, fever, chills, fatigue, weakness. ENT: Negative for hoarseness, difficulty swallowing , nasal congestion. CV: Negative for chest pain, angina, palpitations, dyspnea on exertion, peripheral edema.  Respiratory: Negative for dyspnea at rest, dyspnea on exertion, cough, sputum, wheezing.  GI: See history of present illness. GU:  Negative for dysuria, hematuria, urinary incontinence, urinary frequency, nocturnal urination.  Endo: Negative for unusual weight change.    Physical Examination:   There were  no vitals taken for this visit.  General: Well-nourished, well-developed in no acute distress.  Eyes: No icterus. Conjunctivae pink. Abdomen: Bowel sounds are normal, nontender, nondistended, no hepatosplenomegaly or masses, no abdominal bruits or hernia , no rebound or guarding.   Extremities: No lower extremity edema. No clubbing or deformities. Neuro: Alert and oriented x 3.  Grossly intact. Skin: Warm and dry, no jaundice.   Psych: Alert and  cooperative, normal [name] and affect.   Imaging Studies: No results found.  Assessment and Plan:   Regina [name] is a 29 y.o. y/o femalewith a longstanding history of irritable bowel syndrome with constipation.  Hemorrhoids treated by Dr. Allegra [name] with banding in the past.  Continue to have bleeding and abdominal seen by surgery and underwent Botox injection for posterior fissure.  Failed nifedipine therapy.    Presently if symptoms are functional in nature.  She is already on IBgard.  Will consider changing it to FD guard adding a probiotic.  Unclear based on her history whether her constipation has completely resolved or is better or worse.  I suggest we try a trial of Motegrity.  Dr Regina Mood  Regina [name],MRCP [facility]) Follow up in as needed

## 2020-04-26 ENCOUNTER — Other Ambulatory Visit: Payer: Self-pay

## 2020-04-26 ENCOUNTER — Emergency Department
Admission: EM | Admit: 2020-04-26 | Discharge: 2020-04-26 | Disposition: A | Discharge status: Home / Self Care | Payer: Medicaid Other | Attending: Emergency Medicine | Admitting: Emergency Medicine

## 2020-04-26 ENCOUNTER — Emergency Department: Payer: Medicaid Other

## 2020-04-26 DIAGNOSIS — K219 Gastro-esophageal reflux disease without esophagitis: Secondary | ICD-10-CM | POA: Insufficient documentation

## 2020-04-26 DIAGNOSIS — R0789 Other chest pain: Secondary | ICD-10-CM | POA: Insufficient documentation

## 2020-04-26 DIAGNOSIS — R002 Palpitations: Secondary | ICD-10-CM | POA: Diagnosis not present

## 2020-04-26 DIAGNOSIS — N898 Other specified noninflammatory disorders of vagina: Secondary | ICD-10-CM | POA: Diagnosis not present

## 2020-04-26 DIAGNOSIS — N72 Inflammatory disease of cervix uteri: Secondary | ICD-10-CM | POA: Diagnosis not present

## 2020-04-26 DIAGNOSIS — F17290 Nicotine dependence, other tobacco product, uncomplicated: Secondary | ICD-10-CM | POA: Insufficient documentation

## 2020-04-26 LAB — CBC
HCT: 40.4 % (ref 36.0–46.0)
Hemoglobin: 12.8 g/dL (ref 12.0–15.0)
MCH: 27.2 pg (ref 26.0–34.0)
MCHC: 31.7 g/dL (ref 30.0–36.0)
MCV: 85.8 fL (ref 80.0–100.0)
Platelets: 315 10*3/uL (ref 150–400)
RBC: 4.71 MIL/uL (ref 3.87–5.11)
RDW: 14.2 % (ref 11.5–15.5)
WBC: 8.9 10*3/uL (ref 4.0–10.5)
nRBC: 0 % (ref 0.0–0.2)

## 2020-04-26 LAB — PREGNANCY, URINE: Preg Test, Ur: NEGATIVE

## 2020-04-26 LAB — WET PREP, GENITAL
Clue Cells Wet Prep HPF POC: NONE SEEN
Sperm: NONE SEEN
Trich, Wet Prep: NONE SEEN
Yeast Wet Prep HPF POC: NONE SEEN

## 2020-04-26 LAB — BASIC METABOLIC PANEL
Anion gap: 9 (ref 5–15)
BUN: 9 mg/dL (ref 6–20)
CO2: 25 mmol/L (ref 22–32)
Calcium: 9.5 mg/dL (ref 8.9–10.3)
Chloride: 106 mmol/L (ref 98–111)
Creatinine, Ser: 0.71 mg/dL (ref 0.44–1.00)
GFR calc Af Amer: >60 mL/min (ref >60)
GFR calc non Af Amer: >60 mL/min (ref >60)
Glucose, Bld: 101 mg/dL — ABNORMAL HIGH (ref 70–99)
Potassium: 3.8 mmol/L (ref 3.5–5.1)
Sodium: 140 mmol/L (ref 135–145)

## 2020-04-26 LAB — TROPONIN I (HIGH SENSITIVITY): Troponin I (High Sensitivity): <2 ng/L (ref <18)

## 2020-04-26 MED ORDER — AZITHROMYCIN 1 G PO PACK
1.0000 g | PACK | Freq: Once | ORAL | Status: AC
  Administered 2020-04-26: 1 g via ORAL
  Filled 2020-04-26: qty 1

## 2020-04-26 MED ORDER — LACTATED RINGERS IV BOLUS
1000.0000 mL | Freq: Once | INTRAVENOUS | Status: AC
  Administered 2020-04-26: 1000 mL via INTRAVENOUS

## 2020-04-26 MED ORDER — HYDROCORTISONE ACETATE 25 MG RE SUPP: 25.0000 mg | Freq: Two times a day (BID) | RECTAL | 0 refills | Status: AC | PRN

## 2020-04-26 MED ORDER — CEFTRIAXONE SODIUM 1 G IJ SOLR
500.0000 mg | Freq: Once | INTRAMUSCULAR | Status: AC
  Administered 2020-04-26: 500 mg via INTRAMUSCULAR
  Filled 2020-04-26: qty 10

## 2020-04-26 NOTE — ED Notes (Signed)
Pharmacy called to update allergies, per pt's mother, pt is allergic to sulfa antibiotics. Pharmacy states ordered antibiotics are ok for pt.

## 2020-04-26 NOTE — ED Notes (Signed)
Pelvic cart at bedside- pt getting undressed

## 2020-04-26 NOTE — ED Provider Notes (Signed)
[facility] Emergency Department Provider Note   ____________________________________________   First MD Initiated Contact with Patient 04/26/20 1235     (approximate)  I have reviewed the triage vital signs and the nursing notes.   HISTORY  Chief Complaint Chest Pain    HPI [name] is a 29 y.o. female with past medical history of migraines, ADHD, and anemia who presents to the ED complaining of palpitations and chest pain.  Patient reports that ever since she woke up this morning she has felt like her heart is racing and skipping beats along with intermittent chills.  She denies any fevers, cough, shortness of breath, vomiting, or diarrhea.  She states she has felt similar symptoms in the past, but it has never lasted this long.  She is concerned it could be related to a B12 shot that she got yesterday.  She describes the chest pain as sharp and intermittent, not exacerbated or alleviated by anything.  She also complains of some vaginal itching and discharge, was concerned that she might have a yeast infection but symptoms have not been alleviated by over-the-counter treatments.  She is not currently sexually active.        Past Medical History:  Diagnosis Date  . ADHD (attention deficit hyperactivity disorder)   . Anemia   . Arthritis   . GERD (gastroesophageal reflux disease)   . Migraine headache    about 1x/week since starting Ajovy (post concussion syndome)  . PONV (postoperative nausea and vomiting)   . Scoliosis     Patient Active Problem List   Diagnosis Date Noted  . Intractable migraine with aura without status migrainosus 02/14/2019  . Seizure-like activity ([place]) 09/04/2018  . Headache disorder 04/12/2018  . Post concussion syndrome 04/12/2018    Past Surgical History:  Procedure Laterality Date  . BREAST SURGERY Right    Cyst removal  . COLONOSCOPY WITH PROPOFOL N/A 10/23/2018   Procedure: COLONOSCOPY WITH PROPOFOL;   Surgeon: Jonathon Bellows, MD;  Location: [facility] ENDOSCOPY;  Service: Gastroenterology;  Laterality: N/A;  . SPHINCTEROTOMY N/A 01/22/2020   Procedure: Chemical SPHINCTEROTOMY w/Botox;  Surgeon: Jules Husbands, MD;  Location: [facility] ORS;  Service: General;  Laterality: N/A;  . TONSILLECTOMY Bilateral 10/09/2019   Procedure: TONSILLECTOMY;  Surgeon: Clyde Canterbury, MD;  Location: [place];  Service: ENT;  Laterality: Bilateral;    Prior to Admission medications   Medication Sig Start Date End Date Taking? Authorizing Provider  Charcoal Activated (ACTIVATED CHARCOAL PO) Take 1 tablet by mouth daily as needed (Stomach discomfort).    [provider]  dicyclomine (BENTYL) 10 MG capsule TAKE 1 CAPSULE (10 MG TOTAL) BY MOUTH 4 (FOUR) TIMES DAILY - BEFORE MEALS AND AT BEDTIME. 02/05/20 05/05/20  Jonathon Bellows, MD  fluticasone [facility]) 50 MCG/ACT nasal spray Place 2 sprays into both nostrils daily.  09/10/19   [provider]  Fremanezumab-vfrm (AJOVY) 225 MG/1.5ML SOAJ Inject 225 mg into the skin every 30 (thirty) days.     [provider]  ibuprofen (ADVIL) 600 MG tablet Take 1 tablet (600 mg total) by mouth every 6 (six) hours as needed for moderate pain. 04/04/20   Duanne Guess, PA-C  lactulose (CHRONULAC) 10 GM/15ML solution Take 30 mLs (20 g total) by mouth 2 (two) times daily. Patient not taking: Reported on 03/10/2020 02/06/20   Jonathon Bellows, MD  nystatin (NYSTATIN) powder Apply 1 application topically 2 (two) times daily. 02/27/20   Pabon, Marjory Lies, MD  omeprazole (  PRILOSEC) 20 MG capsule Take 1 capsule (20 mg total) by mouth 2 (two) times daily before a meal. 02/27/20   Pabon, Diego F, MD  polyethylene glycol (MIRALAX / GLYCOLAX) 17 g packet Take 17 g by mouth 2 (two) times daily.    [provider]  PRESCRIPTION MEDICATION Place 1 application rectally in the morning and at bedtime. nifedipine ointment 30 mg    [provider]  Probiotic Product (PROBIOTIC  PO) Take 1 tablet by mouth daily.     [provider]  rizatriptan (MAXALT-MLT) 10 MG disintegrating tablet Take 10 mg by mouth as needed for migraine.  04/02/19   [provider]  trolamine salicylate (ASPERCREME) 10 % cream Apply 1 application topically daily as needed (on feet).    [provider]    Allergies Patient has no known allergies.  History reviewed. No pertinent family history.  Social History Social History   Tobacco Use  . Smoking status: Never Smoker  . Smokeless tobacco: Never Used  Vaping Use  . Vaping Use: Every day  . Substances: Nicotine, CBD  . Devices: Dolores Patty (started approx 2017)  Substance Use Topics  . Alcohol use: No  . Drug use: Not Currently    Types: Cocaine    Review of Systems  Constitutional: No fever/chills Eyes: No visual changes. ENT: No sore throat. Cardiovascular: Positive for palpitations and chest pain. Respiratory: Denies shortness of breath. Gastrointestinal: No abdominal pain.  No nausea, no vomiting.  No diarrhea.  No constipation. Genitourinary: Negative for dysuria.  Positive for vaginal discharge. Musculoskeletal: Negative for back pain. Skin: Negative for rash. Neurological: Negative for headaches, focal weakness or numbness.  ____________________________________________   PHYSICAL EXAM:  VITAL SIGNS: ED Triage Vitals  Enc Vitals Group     BP 04/26/20 1219 (!) 135/95     Pulse Rate 04/26/20 1219 91     Resp 04/26/20 1219 16     Temp 04/26/20 1219 98.4 F (36.9 C)     Temp Source 04/26/20 1219 Oral     SpO2 04/26/20 1219 98 %     Weight 04/26/20 1221 140 lb (63.5 kg)     Height 04/26/20 1221 5\' 8"  (1.727 m)     Head Circumference --      Peak Flow --      Pain Score 04/26/20 1220 10     Pain Loc --      Pain Edu? --      Excl. in GC? --     Constitutional: Alert and oriented. Eyes: Conjunctivae are normal. Head: Atraumatic. Nose: No congestion/rhinnorhea. Mouth/Throat: Mucous  membranes are moist. Neck: Normal ROM Cardiovascular: Normal rate, regular rhythm. Grossly normal heart sounds. Respiratory: Normal respiratory effort.  No retractions. Lungs CTAB. Gastrointestinal: Soft and nontender. No distention. Genitourinary: Cervical irritation noted with thin yellowish discharge, no cervical motion adnexal tenderness. Musculoskeletal: No lower extremity tenderness nor edema. Neurologic:  Normal speech and language. No gross focal neurologic deficits are appreciated. Skin:  Skin is warm, dry and intact. No rash noted. Psychiatric: Mood and affect are normal. Speech and behavior are normal.  ____________________________________________   LABS (all labs ordered are listed, but only abnormal results are displayed)  Labs Reviewed  WET PREP, GENITAL - Abnormal; Notable for the following components:      Result Value   WBC, Wet Prep HPF POC MODERATE (*)    All other components within normal limits  BASIC METABOLIC PANEL - Abnormal; Notable for the following components:  Glucose, Bld 101 (*)    All other components within normal limits  CBC  PREGNANCY, URINE  TROPONIN I (HIGH SENSITIVITY)   ____________________________________________  EKG  ED ECG REPORT I, Chesley Noon, the attending physician, personally viewed and interpreted this ECG.   Date: 04/26/2020  EKG Time: 12:24  Rate: 92  Rhythm: normal sinus rhythm  Axis: Normal  Intervals:none  ST&T Change: Nonspecific T wave changes, similar to prior   PROCEDURES  Procedure(s) performed (including Critical Care):  Procedures   ____________________________________________   INITIAL IMPRESSION / ASSESSMENT AND PLAN / ED COURSE       29 year old female with past medical history of migraines, ADHD, and anemia presents to the ED complaining of sharp chest pain and palpitations since this morning.  EKG shows no evidence of arrhythmia, does have anterior T wave inversions however these are similar  to prior and I doubt acute ischemia in this young and previously healthy patient.  We will screen troponin and labs, observe on cardiac monitor.  She additionally complains of vaginal discomfort and discharge, pelvic exam with evidence of cervicitis and we will treat empirically for STIs, wet prep unremarkable.   Lab work reassuring, troponin within normal limits.  Pregnancy testing is negative.  No other arrhythmia noted on cardiac monitor and patient is appropriate for discharge home.  She was counseled to follow-up with her PCP and otherwise return to the ED for new or worsening symptoms, patient agrees with plan.      ____________________________________________   FINAL CLINICAL IMPRESSION(S) / ED DIAGNOSES  Final diagnoses:  Palpitations  Cervicitis     ED Discharge Orders    None       Note:  This document was prepared using Dragon voice recognition software and may include unintentional dictation errors.   Chesley Noon, MD 04/26/20 1600

## 2020-04-26 NOTE — ED Triage Notes (Signed)
Pt c/o of feeling cold and heart feeling funny like a "racing" feeling. States she got a B12 shot yesterday. Appears anxious. A&O, ambulatory.

## 2020-04-30 ENCOUNTER — Ambulatory Visit (INDEPENDENT_AMBULATORY_CARE_PROVIDER_SITE_OTHER): Payer: Medicaid Other | Admitting: Gastroenterology

## 2020-04-30 ENCOUNTER — Other Ambulatory Visit: Payer: Self-pay

## 2020-04-30 VITALS — BP 110/72 | HR 84 | Temp 97.9°F | Ht 69.0 in | Wt 174.0 lb

## 2020-04-30 DIAGNOSIS — R21 Rash and other nonspecific skin eruption: Secondary | ICD-10-CM

## 2020-04-30 DIAGNOSIS — K30 Functional dyspepsia: Secondary | ICD-10-CM

## 2020-04-30 DIAGNOSIS — K581 Irritable bowel syndrome with constipation: Secondary | ICD-10-CM | POA: Diagnosis not present

## 2020-04-30 NOTE — Progress Notes (Signed)
Wyline Mood MD, MRCP(U.K) [address]  [address]  [place], [place] [address]  Main: [phone]  Fax: [phone]   Primary Care Physician: Aida Puffer, MD  Primary Gastroenterologist:  Dr. Wyline Mood   Follow-up for functional abdominal symptoms  HPI: Regina [name] is a 29 y.o. female    Summary of history :  Regina [name] has been followed for irritable bowel syndrome with constipation.Ongoing her whole life. Grand mother had colon cancer. .She has tried other agents in the past including Amitiza/Linzess which have not worked.MiraLAX did not work.  10/18/2018 : TSH-normal 10/23/18: colonoscopy -normal She has undergone band ligation of internal hemorrhoids by Dr. Allegra [name]. Last was seen on 12/03/2019 01/22/2020: Posterior midline fissure noted by Dr. Everlene [name] and injected with Botox.  Interval history6/07/2020-04/30/2020   At her last visit she was given Motegrity for constipation and seems to work really well.  She has stopped taking it and complains of discomfort in the perianal area.    Current Outpatient Medications  Medication Sig Dispense Refill   Charcoal Activated (ACTIVATED CHARCOAL PO) Take 1 tablet by mouth daily as needed (Stomach discomfort).     dicyclomine (BENTYL) 10 MG capsule TAKE 1 CAPSULE (10 MG TOTAL) BY MOUTH 4 (FOUR) TIMES DAILY - BEFORE MEALS AND AT BEDTIME. 120 capsule 2   fluticasone (FLONASE) 50 MCG/ACT nasal spray Place 2 sprays into both nostrils daily.      Fremanezumab-vfrm (AJOVY) 225 MG/1.5ML SOAJ Inject 225 mg into the skin every 30 (thirty) days.      hydrocortisone (ANUSOL-HC) 25 MG suppository Place 1 suppository (25 mg total) rectally 2 (two) times daily as needed for hemorrhoids or anal itching. 12 suppository 0   ibuprofen (ADVIL) 600 MG tablet Take 1 tablet (600 mg total) by mouth every 6 (six) hours as needed for moderate pain. 30 tablet 0   lactulose (CHRONULAC) 10 GM/15ML solution Take 30 mLs (20 g total) by mouth 2  (two) times daily. (Patient not taking: Reported on 03/10/2020) 1800 mL 2   nystatin (NYSTATIN) powder Apply 1 application topically 2 (two) times daily. 60 g 1   omeprazole (PRILOSEC) 20 MG capsule Take 1 capsule (20 mg total) by mouth 2 (two) times daily before a meal. 60 capsule 1   polyethylene glycol (MIRALAX / GLYCOLAX) 17 g packet Take 17 g by mouth 2 (two) times daily.     PRESCRIPTION MEDICATION Place 1 application rectally in the morning and at bedtime. nifedipine ointment 30 mg     Probiotic Product (PROBIOTIC PO) Take 1 tablet by mouth daily.      rizatriptan (MAXALT-MLT) 10 MG disintegrating tablet Take 10 mg by mouth as needed for migraine.      trolamine salicylate (ASPERCREME) 10 % cream Apply 1 application topically daily as needed (on feet).     No current facility-administered medications for this visit.    Allergies as of 04/30/2020 - Review Complete 04/26/2020  Allergen Reaction Noted   Sulfa antibiotics Nausea And Vomiting and Swelling 04/26/2020    ROS:  General: Negative for anorexia, weight loss, fever, chills, fatigue, weakness. ENT: Negative for hoarseness, difficulty swallowing , nasal congestion. CV: Negative for chest pain, angina, palpitations, dyspnea on exertion, peripheral edema.  Respiratory: Negative for dyspnea at rest, dyspnea on exertion, cough, sputum, wheezing.  GI: See history of present illness. GU:  Negative for dysuria, hematuria, urinary incontinence, urinary frequency, nocturnal urination.  Endo: Negative for unusual weight change.    Physical Examination:   LMP  03/31/2020   General: Well-nourished, well-developed in no acute distress.  With a chaperone in the room with my CMA examined the perianal area which appeared excoriated and erythematous and mildly tender on touch but no raised local temperature. Psych: Alert and cooperative, normal mood and affect.   Imaging Studies: DG Chest 2 View  Result Date: 04/26/2020 CLINICAL  DATA:  29 year old female with a history of chest pain EXAM: CHEST - 2 VIEW COMPARISON:  11/24/2010 FINDINGS: The heart size and mediastinal contours are within normal limits. Both lungs are clear. The visualized skeletal structures are unremarkable. IMPRESSION: Negative for acute cardiopulmonary disease. Electronically Signed   By: Corrie Mckusick D.O.   On: 04/26/2020 13:15    Assessment and Plan:   Regina [name] is a 29 y.o. y/o female here to see me for perianal discomfort.  Prior history of IBS with constipation which is responded well to Motegrity to the point she may have occasional diarrhea.  She has stopped taking it.  On perianal exam appears to have some form of contact dermatitis.  She has been applying multiple agents to her perianal area for burning.  I explained to her that she stop using all these agents and avoid excess trauma to the area after a bowel movement by not rubbing excessively with tissue paper.  Using sitz bath at night.  And could use a trial of Desitin for 2 to 3 days.  Does not appear infected  Dr Regina Bellows  MD,MRCP [facility]) Follow up in as needed

## 2020-05-18 ENCOUNTER — Emergency Department: Payer: Medicaid Other

## 2020-05-18 ENCOUNTER — Encounter: Payer: Self-pay | Admitting: Emergency Medicine

## 2020-05-18 ENCOUNTER — Emergency Department
Admission: EM | Admit: 2020-05-18 | Discharge: 2020-05-18 | Disposition: A | Discharge status: Home / Self Care | Payer: Medicaid Other | Attending: Emergency Medicine | Admitting: Emergency Medicine

## 2020-05-18 ENCOUNTER — Other Ambulatory Visit: Payer: Self-pay

## 2020-05-18 DIAGNOSIS — Z77098 Contact with and (suspected) exposure to other hazardous, chiefly nonmedicinal, chemicals: Secondary | ICD-10-CM | POA: Insufficient documentation

## 2020-05-18 DIAGNOSIS — F17290 Nicotine dependence, other tobacco product, uncomplicated: Secondary | ICD-10-CM | POA: Insufficient documentation

## 2020-05-18 MED ORDER — ALBUTEROL SULFATE HFA 108 (90 BASE) MCG/ACT IN AERS
2.0000 | INHALATION_SPRAY | Freq: Four times a day (QID) | RESPIRATORY_TRACT | 0 refills | Status: AC | PRN
Start: 2020-05-18 — End: ?

## 2020-05-18 MED ORDER — BACITRACIN-NEOMYCIN-POLYMYXIN 400-5-5000 EX OINT
TOPICAL_OINTMENT | Freq: Once | CUTANEOUS | Status: AC
  Administered 2020-05-18: 1 via TOPICAL
  Filled 2020-05-18: qty 1

## 2020-05-18 NOTE — Discharge Instructions (Addendum)
Follow-up with your regular doctor if not improving in 3 days.  Return emergency department worsening.  Use inhaler as needed.  Continue to apply Vaseline to the inner nasal mucosa.  This will help with some of the burning.  Return if worsening

## 2020-05-18 NOTE — ED Triage Notes (Signed)
Pt presents to ED via POV, states was cleaning with bleach yesterday and didn't dilute the bleach. Pt reports that she can still smell the bleach today in her nose, and reports feeling like her eyes are burning. Pt with no noted redness to her eyes at this time. Pt states has cleaned with bleach multiple times before without incident. Pt states used eye drops PTA that made it worse.   Pt states last exposure to the bleach was approx 24 hrs ago at approx 11am on 7/10. Pt with even and unlabored respirations at this time.

## 2020-05-18 NOTE — ED Provider Notes (Signed)
[facility] Emergency Department Provider Note  ____________________________________________   First MD Initiated Contact with Patient 05/18/20 1207     (approximate)  I have reviewed the triage vital signs and the nursing notes.   HISTORY  Chief Complaint Chemical Exposure    HPI Regina [name] is a 29 y.o. female presents emergency department stating that she cleaned with straight bleach yesterday.  She was mopping while they were cleaning her house.  States that today her eyes have been burning her nose is burning and she has had burning in her throat lungs.  Some cough.  No other complaints.  Did not splash into her eyes or face.  No fever or chills.    Past Medical History:  Diagnosis Date  . ADHD (attention deficit hyperactivity disorder)   . Anemia   . Arthritis   . GERD (gastroesophageal reflux disease)   . Migraine headache    about 1x/week since starting Ajovy (post concussion syndome)  . PONV (postoperative nausea and vomiting)   . Scoliosis     Patient Active Problem List   Diagnosis Date Noted  . Intractable migraine with aura without status migrainosus 02/14/2019  . Seizure-like activity (HCC) 09/04/2018  . Headache disorder 04/12/2018  . Post concussion syndrome 04/12/2018    Past Surgical History:  Procedure Laterality Date  . BREAST SURGERY Right    Cyst removal  . COLONOSCOPY WITH PROPOFOL N/A 10/23/2018   Procedure: COLONOSCOPY WITH PROPOFOL;  Surgeon: Wyline Mood, MD;  Location: [facility] ENDOSCOPY;  Service: Gastroenterology;  Laterality: N/A;  . SPHINCTEROTOMY N/A 01/22/2020   Procedure: Chemical SPHINCTEROTOMY w/Botox;  Surgeon: Leafy Ro, MD;  Location: [facility] ORS;  Service: General;  Laterality: N/A;  . TONSILLECTOMY Bilateral 10/09/2019   Procedure: TONSILLECTOMY;  Surgeon: Geanie Logan, MD;  Location: [facility];  Service: ENT;  Laterality: Bilateral;    Prior to Admission medications   Medication Sig  Start Date End Date Taking? Authorizing Provider  albuterol (VENTOLIN HFA) 108 (90 Base) MCG/ACT inhaler Inhale 2 puffs into the lungs every 6 (six) hours as needed for wheezing or shortness of breath. 05/18/20   Danyel Tobey, Roselyn Bering, PA-C  Charcoal Activated (ACTIVATED CHARCOAL PO) Take 1 tablet by mouth daily as needed (Stomach discomfort).    [provider]  dicyclomine (BENTYL) 10 MG capsule TAKE 1 CAPSULE (10 MG TOTAL) BY MOUTH 4 (FOUR) TIMES DAILY - BEFORE MEALS AND AT BEDTIME. 02/05/20 05/05/20  Wyline Mood, MD  fluticasone [facility]) 50 MCG/ACT nasal spray Place 2 sprays into both nostrils daily.  09/10/19   [provider]  Fremanezumab-vfrm (AJOVY) 225 MG/1.5ML SOAJ Inject 225 mg into the skin every 30 (thirty) days.     [provider]  hydrocortisone (ANUSOL-HC) 25 MG suppository Place 1 suppository (25 mg total) rectally 2 (two) times daily as needed for hemorrhoids or anal itching. 04/26/20 04/26/21  Shaune Pollack, MD  ibuprofen (ADVIL) 600 MG tablet Take 1 tablet (600 mg total) by mouth every 6 (six) hours as needed for moderate pain. 04/04/20   Evon Slack, PA-C  lactulose (CHRONULAC) 10 GM/15ML solution Take 30 mLs (20 g total) by mouth 2 (two) times daily. Patient not taking: Reported on 03/10/2020 02/06/20   Wyline Mood, MD  nystatin (NYSTATIN) powder Apply 1 application topically 2 (two) times daily. 02/27/20   Pabon, Merri Ray, MD  omeprazole (PRILOSEC) 20 MG capsule Take 1 capsule (20 mg total) by mouth 2 (two) times daily before a meal. 02/27/20  Pabon, Diego F, MD  polyethylene glycol (MIRALAX / GLYCOLAX) 17 g packet Take 17 g by mouth 2 (two) times daily.    [provider]  PRESCRIPTION MEDICATION Place 1 application rectally in the morning and at bedtime. nifedipine ointment 30 mg    [provider]  Probiotic Product (PROBIOTIC PO) Take 1 tablet by mouth daily.     [provider]  rizatriptan (MAXALT-MLT) 10 MG disintegrating  tablet Take 10 mg by mouth as needed for migraine.  04/02/19   [provider]  trolamine salicylate (ASPERCREME) 10 % cream Apply 1 application topically daily as needed (on feet).    [provider]    Allergies Sulfa antibiotics  History reviewed. No pertinent family history.  Social History Social History   Tobacco Use  . Smoking status: Never Smoker  . Smokeless tobacco: Never Used  Vaping Use  . Vaping Use: Every day  . Substances: Nicotine, CBD  . Devices: Dolores Patty (started approx 2017)  Substance Use Topics  . Alcohol use: No  . Drug use: Not Currently    Types: Cocaine    Review of Systems  Constitutional: No fever/chills Eyes: No visual changes. ENT: Positive sore throat. Respiratory: Positive cough Cardiovascular: Denies chest pain Gastrointestinal: Denies abdominal pain Genitourinary: Negative for dysuria. Musculoskeletal: Negative for back pain. Skin: Negative for rash. Psychiatric: no mood changes,     ____________________________________________   PHYSICAL EXAM:  VITAL SIGNS: ED Triage Vitals  Enc Vitals Group     BP 05/18/20 1017 (!) 127/93     Pulse Rate 05/18/20 1017 90     Resp 05/18/20 1017 16     Temp 05/18/20 1017 99.8 F (37.7 C)     Temp Source 05/18/20 1017 Oral     SpO2 05/18/20 1017 99 %     Weight 05/18/20 1025 174 lb (78.9 kg)     Height 05/18/20 1025 5\' 9"  (1.753 m)     Head Circumference --      Peak Flow --      Pain Score 05/18/20 1024 10     Pain Loc --      Pain Edu? --      Excl. in GC? --     Constitutional: Alert and oriented. Well appearing and in no acute distress. Eyes: Conjunctivae are normal.  No redness Head: Atraumatic. Nose: No congestion/rhinnorhea.  Nasal mucosa is slightly red and irritated Mouth/Throat: Mucous membranes are moist.  Throat appears normal Neck:  supple no lymphadenopathy noted Cardiovascular: Normal rate, regular rhythm. Heart sounds are normal Respiratory: Normal  respiratory effort.  No retractions, lungs c t a  GU: deferred Musculoskeletal: FROM all extremities, warm and well perfused Neurologic:  Normal speech and language.  Skin:  Skin is warm, dry and intact. No rash noted. Psychiatric: Mood and affect are normal. Speech and behavior are normal.  ____________________________________________   LABS (all labs ordered are listed, but only abnormal results are displayed)  Labs Reviewed - No data to display ____________________________________________   ____________________________________________  RADIOLOGY  Chest x-ray is normal  ____________________________________________   PROCEDURES  Procedure(s) performed: No  Procedures    ____________________________________________   INITIAL IMPRESSION / ASSESSMENT AND PLAN / ED COURSE  Pertinent labs & imaging results that were available during my care of the patient were reviewed by me and considered in my medical decision making (see chart for details).   Patient is a 29 year old female presented to the emergency department with complaints of a chemical  exposure/bleach.  She cleaned with straight bleach and did not dilute it yesterday.  Complaining of burning in the nasal passages, throat, and lungs.  See HPI  Physical exam is reassuring.  mostly benign.  Mild erythema noted inside the nares.  Remainder the exam is unremarkable  Chest x-ray Neosporin to the nares    ----------------------------------------- 2:13 PM on 05/18/2020 -----------------------------------------  I explained the chest x-ray results to the patient.  They are negative.  She was given a prescription for albuterol inhaler she has difficulty breathing and coughing.  She states she does feel better with the Neosporin inside her nose that did calm some of the burning down.  Had long discussion about how to use bleach and that you must dilute it.  States she understands.  Patient is to return if worsening.  She  is discharged stable condition.   As part of my medical decision making, I reviewed the following data within the electronic MEDICAL RECORD NUMBER Nursing notes reviewed and incorporated, Old chart reviewed, Radiograph reviewed , Notes from prior ED visits and [place] Controlled Substance Database  ____________________________________________   FINAL CLINICAL IMPRESSION(S) / ED DIAGNOSES  Final diagnoses:  Chemical exposure      NEW MEDICATIONS STARTED DURING THIS VISIT:  New Prescriptions   ALBUTEROL (VENTOLIN HFA) 108 (90 BASE) MCG/ACT INHALER    Inhale 2 puffs into the lungs every 6 (six) hours as needed for wheezing or shortness of breath.     Note:  This document was prepared using Dragon voice recognition software and may include unintentional dictation errors.    Faythe Ghee, PA-C 05/18/20 1414    Concha Se, MD 05/19/20 1322

## 2020-06-07 ENCOUNTER — Emergency Department
Admission: EM | Admit: 2020-06-07 | Discharge: 2020-06-07 | Disposition: A | Discharge status: Home / Self Care | Payer: Medicaid Other | Attending: Emergency Medicine | Admitting: Emergency Medicine

## 2020-06-07 ENCOUNTER — Encounter: Payer: Self-pay | Admitting: Emergency Medicine

## 2020-06-07 ENCOUNTER — Emergency Department: Payer: Medicaid Other

## 2020-06-07 ENCOUNTER — Other Ambulatory Visit: Payer: Self-pay

## 2020-06-07 DIAGNOSIS — R079 Chest pain, unspecified: Secondary | ICD-10-CM | POA: Diagnosis not present

## 2020-06-07 DIAGNOSIS — R0602 Shortness of breath: Secondary | ICD-10-CM | POA: Diagnosis not present

## 2020-06-07 DIAGNOSIS — Z5321 Procedure and treatment not carried out due to patient leaving prior to being seen by health care provider: Secondary | ICD-10-CM | POA: Diagnosis not present

## 2020-06-07 LAB — BASIC METABOLIC PANEL
Anion gap: 9 (ref 5–15)
BUN: 11 mg/dL (ref 6–20)
CO2: 26 mmol/L (ref 22–32)
Calcium: 9.1 mg/dL (ref 8.9–10.3)
Chloride: 105 mmol/L (ref 98–111)
Creatinine, Ser: 0.74 mg/dL (ref 0.44–1.00)
GFR calc Af Amer: >60 mL/min (ref >60)
GFR calc non Af Amer: >60 mL/min (ref >60)
Glucose, Bld: 107 mg/dL — ABNORMAL HIGH (ref 70–99)
Potassium: 3.6 mmol/L (ref 3.5–5.1)
Sodium: 140 mmol/L (ref 135–145)

## 2020-06-07 LAB — CBC
HCT: 38.5 % (ref 36.0–46.0)
Hemoglobin: 12.2 g/dL (ref 12.0–15.0)
MCH: 27.5 pg (ref 26.0–34.0)
MCHC: 31.7 g/dL (ref 30.0–36.0)
MCV: 86.7 fL (ref 80.0–100.0)
Platelets: 322 10*3/uL (ref 150–400)
RBC: 4.44 MIL/uL (ref 3.87–5.11)
RDW: 14.2 % (ref 11.5–15.5)
WBC: 9.8 10*3/uL (ref 4.0–10.5)
nRBC: 0 % (ref 0.0–0.2)

## 2020-06-07 LAB — TROPONIN I (HIGH SENSITIVITY)
Troponin I (High Sensitivity): <2 ng/L (ref <18)
Troponin I (High Sensitivity): <2 ng/L (ref <18)

## 2020-06-07 MED ORDER — SODIUM CHLORIDE 0.9% FLUSH: 3.0000 mL | Freq: Once | INTRAVENOUS | Status: DC

## 2020-06-07 NOTE — ED Notes (Signed)
Pt not present while RN rounding for two rounds. RN has checked inside and outside as well as the bathroom.

## 2020-06-07 NOTE — ED Triage Notes (Signed)
Pt to ED via POV for shortness of breath. Pt states that the shortness of breath started yesterday. Pt states that she was using an inhaler that she had been given before and this temporarily relieved her symptoms. Pt states that she is also having pain in the center of her chest and feel like her heart is "trying to stop". When asked what she meant by this pts states that her hearts started racing and she can see her whole body move and it takes a long time for it to slow then, then she gets very fatigue. Pt is in NAD.

## 2020-06-18 ENCOUNTER — Telehealth: Payer: Self-pay

## 2020-06-18 NOTE — Telephone Encounter (Signed)
Pt left vm requesting Dr. Johnney Killian advice. Pt states she experiencing "burning in her guts" and acid reflux. Pt states nothing has helped relieve her symptoms.

## 2020-06-22 NOTE — Telephone Encounter (Signed)
How often is she having a bowel movement and what is she taking ?

## 2020-06-23 NOTE — Telephone Encounter (Signed)
Increase prilosec to 40 mg BID, if no better ibn two weeks to contact us

## 2020-06-24 ENCOUNTER — Other Ambulatory Visit: Payer: Self-pay

## 2020-06-24 MED ORDER — MOTEGRITY 2 MG PO TABS: 2.0000 mg | ORAL_TABLET | Freq: Every day | ORAL | 1 refills | Status: AC

## 2020-06-24 MED ORDER — OMEPRAZOLE 40 MG PO CPDR
40.0000 mg | DELAYED_RELEASE_CAPSULE | Freq: Two times a day (BID) | ORAL | 0 refills | Status: AC
Start: 2020-06-24 — End: 2020-09-22

## 2020-06-24 NOTE — Telephone Encounter (Signed)
Called pt to inform her of Dr. Johnney Killian recommendations. Unable to contact LVM to return call.

## 2020-07-24 ENCOUNTER — Telehealth: Payer: Self-pay

## 2020-07-24 NOTE — Telephone Encounter (Signed)
Pt called to request Dr. Johnney Killian advice. Pt states she used an enema today then had a bowel movement and noticed something "flesh-like" in her stool. Pt states she had a small amount of blood with the finding. Pt also states she's had abdominal pain as well.

## 2020-07-28 NOTE — Telephone Encounter (Signed)
Inform her to monitor further stools and if persists to inform us so we can order a stool test

## 2020-07-29 NOTE — Telephone Encounter (Signed)
Ok schedule for banding

## 2020-08-06 ENCOUNTER — Ambulatory Visit (INDEPENDENT_AMBULATORY_CARE_PROVIDER_SITE_OTHER): Payer: Medicaid Other | Admitting: Gastroenterology

## 2020-08-06 VITALS — BP 125/84 | HR 111 | Temp 98.1°F | Ht 69.0 in | Wt 168.0 lb

## 2020-08-06 DIAGNOSIS — K625 Hemorrhage of anus and rectum: Secondary | ICD-10-CM | POA: Diagnosis not present

## 2020-08-06 DIAGNOSIS — L290 Pruritus ani: Secondary | ICD-10-CM | POA: Diagnosis not present

## 2020-08-06 NOTE — Progress Notes (Signed)
Regina Mood MD, MRCP(U.K) [address]  [address]  [place], [place] [address]  Main: [phone]  Fax: [phone]   Primary Care Physician: Regina Puffer, MD  Primary Gastroenterologist:  Dr. Wyline [name]    Banding of internal hemorrhoids that has failed conservative management  HPI: Regina [name] is a 29 y.o. female    Summary of history : Regina [name] has been followed for irritable bowel syndrome with constipation.Ongoing her whole life. Grand mother had colon cancer. .She has tried other agents in the past including Amitiza/Linzess which have not worked.MiraLAX did not work.  10/18/2018 : TSH-normal 10/23/18: colonoscopy -normal She has undergone band ligation of internal hemorrhoids by Dr. Allegra [name]. Last was seen on 12/03/2019 01/22/2020: Posterior midline fissure noted by Dr. Everlene [name] and injected with Botox.   Interval history   04/30/2020-08/06/2020  Perianal itching persists, persistent rectal bleeding despite conservative management.  She states the itching is the bigger issue rather than the bleeding.   Patient follow-ups today for banding of hemorrhoids January 2021 right posterior hemorrhoids banded, October 2020 left lateral banded in October 2020 right posterior again banded and an 06/28/2019 right anterior banded   Digital rectal exam performed in the presence of a chaperone. External anal findings: Normal Internal findings: Normal, No masses, no blood on glove noticed.    PROCEDURE NOTE: The patient presents with symptomatic grade Clinically suggestive of internal prolapsing hemorrhoids, unresponsive to maximal medical therapy, requesting rubber band ligation of his/her hemorrhoidal disease.  All risks, benefits and alternative forms of therapy were described and informed consent was obtained.  In the Left Lateral Decubitus position (if anoscopy is performed) anoscopic examination revealed grade No hemorrhoids seen       Current Outpatient  Medications  Medication Sig Dispense Refill  . albuterol (VENTOLIN HFA) 108 (90 Base) MCG/ACT inhaler Inhale 2 puffs into the lungs every 6 (six) hours as needed for wheezing or shortness of breath. 16 g 0  . Charcoal Activated (ACTIVATED CHARCOAL PO) Take 1 tablet by mouth daily as needed (Stomach discomfort).    Marland Kitchen dicyclomine (BENTYL) 10 MG capsule TAKE 1 CAPSULE (10 MG TOTAL) BY MOUTH 4 (FOUR) TIMES DAILY - BEFORE MEALS AND AT BEDTIME. 120 capsule 2  . fluticasone (FLONASE) 50 MCG/ACT nasal spray Place 2 sprays into both nostrils daily.     . Fremanezumab-vfrm (AJOVY) 225 MG/1.5ML SOAJ Inject 225 mg into the skin every 30 (thirty) days.     . hydrocortisone (ANUSOL-HC) 25 MG suppository Place 1 suppository (25 mg total) rectally 2 (two) times daily as needed for hemorrhoids or anal itching. 12 suppository 0  . ibuprofen (ADVIL) 600 MG tablet Take 1 tablet (600 mg total) by mouth every 6 (six) hours as needed for moderate pain. 30 tablet 0  . lactulose (CHRONULAC) 10 GM/15ML solution Take 30 mLs (20 g total) by mouth 2 (two) times daily. (Patient not taking: Reported on 03/10/2020) 1800 mL 2  . nystatin (NYSTATIN) powder Apply 1 application topically 2 (two) times daily. 60 g 1  . omeprazole (PRILOSEC) 40 MG capsule Take 1 capsule (40 mg total) by mouth 2 (two) times daily. 180 capsule 0  . polyethylene glycol (MIRALAX / GLYCOLAX) 17 g packet Take 17 g by mouth 2 (two) times daily.    Marland Kitchen PRESCRIPTION MEDICATION Place 1 application rectally in the morning and at bedtime. nifedipine ointment 30 mg    . Probiotic Product (PROBIOTIC PO) Take 1 tablet by mouth daily.     . Prucalopride  Succinate (MOTEGRITY) 2 MG TABS Take 1 tablet (2 mg total) by mouth daily. 90 tablet 1  . rizatriptan (MAXALT-MLT) 10 MG disintegrating tablet Take 10 mg by mouth as needed for migraine.     . trolamine salicylate (ASPERCREME) 10 % cream Apply 1 application topically daily as needed (on feet).     No current  facility-administered medications for this visit.    Allergies as of 08/06/2020 - Review Complete 06/07/2020  Allergen Reaction Noted  . Sulfa antibiotics Nausea And Vomiting and Swelling 04/26/2020    ROS:  General: Negative for anorexia, weight loss, fever, chills, fatigue, weakness. ENT: Negative for hoarseness, difficulty swallowing , nasal congestion. CV: Negative for chest pain, angina, palpitations, dyspnea on exertion, peripheral edema.  Respiratory: Negative for dyspnea at rest, dyspnea on exertion, cough, sputum, wheezing.  GI: See history of present illness. GU:  Negative for dysuria, hematuria, urinary incontinence, urinary frequency, nocturnal urination.  Endo: Negative for unusual weight change.    Physical Examination:   There were no vitals taken for this visit.  General: Well-nourished, well-developed in no acute distress.  Eyes: No icterus. Conjunctivae pink. Psych: Alert and cooperative, normal [name] and affect.   Imaging Studies: No results found.  Assessment and Plan:   Regina [name] is a 29 y.o. y/o female is here today to see me for internal hemorrhoids that have been banded 4 times in the past.  Patient continues to mention that she has ongoing perianal itching.  On examination no abnormality of the skin in the perianal area.  On anoscopy no prolapsing hemorrhoids either noted.  It is possible that she may have had some dermatitis of the skin in that area.  Will try her on a course of Desitin i.e. zinc oxide topical preparation to create a barrier.  Discussed about perianal hygiene.  I do not see any large hemorrhoids that require banding at this point of time.  The patient insists she may be anemic hence I will check a CBC.  Dr Regina Mood  MD,MRCP [facility]) Follow up in as needed

## 2020-08-07 LAB — CBC
Hematocrit: 35.6 % (ref 34.0–46.6)
Hemoglobin: 11.5 g/dL (ref 11.1–15.9)
MCH: 26.8 pg (ref 26.6–33.0)
MCHC: 32.3 g/dL (ref 31.5–35.7)
MCV: 83 fL (ref 79–97)
Platelets: 330 10*3/uL (ref 150–450)
RBC: 4.29 x10E6/uL (ref 3.77–5.28)
RDW: 13.4 % (ref 11.7–15.4)
WBC: 9.4 10*3/uL (ref 3.4–10.8)

## 2020-08-07 NOTE — Progress Notes (Signed)
Hb stable and normal

## 2020-08-08 ENCOUNTER — Telehealth: Payer: Self-pay

## 2020-08-08 NOTE — Telephone Encounter (Signed)
Pt has been notified of results and Dr. Anna's recommendations. 

## 2020-08-08 NOTE — Telephone Encounter (Signed)
-----   Message from Wyline Mood, MD sent at 08/07/2020  8:22 AM EDT ----- Hb stable and normal

## 2020-08-14 ENCOUNTER — Encounter: Payer: Self-pay | Admitting: Emergency Medicine

## 2020-08-14 ENCOUNTER — Other Ambulatory Visit: Payer: Self-pay

## 2020-08-14 ENCOUNTER — Emergency Department
Admission: EM | Admit: 2020-08-14 | Discharge: 2020-08-15 | Disposition: A | Discharge status: Home / Self Care | Payer: Medicaid Other | Attending: Emergency Medicine | Admitting: Emergency Medicine

## 2020-08-14 DIAGNOSIS — R202 Paresthesia of skin: Secondary | ICD-10-CM | POA: Diagnosis not present

## 2020-08-14 DIAGNOSIS — F909 Attention-deficit hyperactivity disorder, unspecified type: Secondary | ICD-10-CM | POA: Diagnosis not present

## 2020-08-14 DIAGNOSIS — R21 Rash and other nonspecific skin eruption: Secondary | ICD-10-CM | POA: Diagnosis present

## 2020-08-14 DIAGNOSIS — T782XXA Anaphylactic shock, unspecified, initial encounter: Secondary | ICD-10-CM | POA: Diagnosis not present

## 2020-08-14 DIAGNOSIS — T7840XA Allergy, unspecified, initial encounter: Secondary | ICD-10-CM

## 2020-08-14 HISTORY — DX: Anxiety disorder, unspecified: F41.9

## 2020-08-14 MED ORDER — FAMOTIDINE IN NACL 20-0.9 MG/50ML-% IV SOLN
20.0000 mg | Freq: Once | INTRAVENOUS | Status: AC
  Administered 2020-08-15: 20 mg via INTRAVENOUS
  Filled 2020-08-14: qty 50

## 2020-08-14 MED ORDER — DIPHENHYDRAMINE HCL 50 MG/ML IJ SOLN
25.0000 mg | Freq: Once | INTRAMUSCULAR | Status: AC
  Administered 2020-08-14: 25 mg via INTRAVENOUS
  Filled 2020-08-14: qty 1

## 2020-08-14 MED ORDER — SODIUM CHLORIDE 0.9 % IV BOLUS
1000.0000 mL | Freq: Once | INTRAVENOUS | Status: AC
  Administered 2020-08-14: 1000 mL via INTRAVENOUS

## 2020-08-14 MED ORDER — EPINEPHRINE 0.3 MG/0.3ML IJ SOAJ
INTRAMUSCULAR | Status: AC
  Administered 2020-08-15: 0.3 mg via INTRAMUSCULAR
  Filled 2020-08-14: qty 0.3

## 2020-08-14 MED ORDER — METHYLPREDNISOLONE SODIUM SUCC 125 MG IJ SOLR
125.0000 mg | Freq: Once | INTRAMUSCULAR | Status: AC
  Administered 2020-08-15: 125 mg via INTRAVENOUS
  Filled 2020-08-14: qty 2

## 2020-08-14 MED ORDER — EPINEPHRINE 0.3 MG/0.3ML IJ SOAJ: 0.3000 mg | Freq: Once | INTRAMUSCULAR | Status: AC

## 2020-08-14 NOTE — ED Triage Notes (Signed)
Pt to ED from home c/o allergic reaction noticed today.  Started on cipro yesterday for UTI, today noticed rash to neck, bilateral legs, abd that is itching.  States difficulty swallowing and having to spit out secretions, with tingling to back of throat.  Pt speaking in complete and coherent sentences at this time, no obvious swelling noted, chest rise even and unlabored.

## 2020-08-14 NOTE — ED Provider Notes (Signed)
[facility] Emergency Department Provider Note  ____________________________________________   First MD Initiated Contact with Patient 08/14/20 2347     (approximate)  I have reviewed the triage vital signs and the nursing notes.   HISTORY  Chief Complaint Allergic Reaction    HPI Regina [name] is a 29 y.o. female with below list of previous medical conditions including recently diagnosed urinary tract infection for which patient was prescribed Cipro.  Patient states that she was given 1 dose at the urgent care and then subsequently took her first dose at home yesterday after taking the dose today the patient noticed generalized pruritic rash difficulty breathing swallow and swallowing with a tingling sensation in the posterior portion of her throat.  Patient denies any vomiting however does admit to nausea.       Past Medical History:  Diagnosis Date  . ADHD (attention deficit hyperactivity disorder)   . Anemia   . Arthritis   . GERD (gastroesophageal reflux disease)   . Migraine headache    about 1x/week since starting Ajovy (post concussion syndome)  . PONV (postoperative nausea and vomiting)   . Scoliosis     Patient Active Problem List   Diagnosis Date Noted  . Intractable migraine with aura without status migrainosus 02/14/2019  . Seizure-like activity (HCC) 09/04/2018  . Headache disorder 04/12/2018  . Post concussion syndrome 04/12/2018    Past Surgical History:  Procedure Laterality Date  . BREAST SURGERY Right    Cyst removal  . COLONOSCOPY WITH PROPOFOL N/A 10/23/2018   Procedure: COLONOSCOPY WITH PROPOFOL;  Surgeon: Wyline Mood, MD;  Location: [facility] ENDOSCOPY;  Service: Gastroenterology;  Laterality: N/A;  . SPHINCTEROTOMY N/A 01/22/2020   Procedure: Chemical SPHINCTEROTOMY w/Botox;  Surgeon: Leafy Ro, MD;  Location: [facility] ORS;  Service: General;  Laterality: N/A;  . TONSILLECTOMY Bilateral 10/09/2019   Procedure:  TONSILLECTOMY;  Surgeon: Geanie Logan, MD;  Location: [facility];  Service: ENT;  Laterality: Bilateral;    Prior to Admission medications   Medication Sig Start Date End Date Taking? Authorizing Provider  albuterol (VENTOLIN HFA) 108 (90 Base) MCG/ACT inhaler Inhale 2 puffs into the lungs every 6 (six) hours as needed for wheezing or shortness of breath. 05/18/20   Fisher, Roselyn Bering, PA-C  Charcoal Activated (ACTIVATED CHARCOAL PO) Take 1 tablet by mouth daily as needed (Stomach discomfort).    [provider]  dicyclomine (BENTYL) 10 MG capsule TAKE 1 CAPSULE (10 MG TOTAL) BY MOUTH 4 (FOUR) TIMES DAILY - BEFORE MEALS AND AT BEDTIME. 02/05/20 05/05/20  Wyline Mood, MD  fluticasone [facility]) 50 MCG/ACT nasal spray Place 2 sprays into both nostrils daily.  09/10/19   [provider]  Fremanezumab-vfrm (AJOVY) 225 MG/1.5ML SOAJ Inject 225 mg into the skin every 30 (thirty) days.     [provider]  hydrocortisone (ANUSOL-HC) 25 MG suppository Place 1 suppository (25 mg total) rectally 2 (two) times daily as needed for hemorrhoids or anal itching. 04/26/20 04/26/21  Shaune Pollack, MD  ibuprofen (ADVIL) 600 MG tablet Take 1 tablet (600 mg total) by mouth every 6 (six) hours as needed for moderate pain. 04/04/20   Evon Slack, PA-C  lactulose (CHRONULAC) 10 GM/15ML solution Take 30 mLs (20 g total) by mouth 2 (two) times daily. Patient not taking: Reported on 03/10/2020 02/06/20   Wyline Mood, MD  nystatin (NYSTATIN) powder Apply 1 application topically 2 (two) times daily. 02/27/20   Pabon, Merri Ray, MD  omeprazole (PRILOSEC) 40  MG capsule Take 1 capsule (40 mg total) by mouth 2 (two) times daily. 06/24/20 09/22/20  Wyline Mood, MD  polyethylene glycol (MIRALAX / GLYCOLAX) 17 g packet Take 17 g by mouth 2 (two) times daily.    [provider]  PRESCRIPTION MEDICATION Place 1 application rectally in the morning and at bedtime. nifedipine ointment 30 mg    [provider]  Probiotic Product (PROBIOTIC PO) Take 1 tablet by mouth daily.     [provider]  Prucalopride Succinate (MOTEGRITY) 2 MG TABS Take 1 tablet (2 mg total) by mouth daily. 06/24/20 12/21/20  Wyline Mood, MD  rizatriptan (MAXALT-MLT) 10 MG disintegrating tablet Take 10 mg by mouth as needed for migraine.  04/02/19   [provider]  trolamine salicylate (ASPERCREME) 10 % cream Apply 1 application topically daily as needed (on feet).    [provider]    Allergies Sulfa antibiotics  History reviewed. No pertinent family history.  Social History Social History   Tobacco Use  . Smoking status: Never Smoker  . Smokeless tobacco: Never Used  Vaping Use  . Vaping Use: Every day  . Substances: Nicotine, CBD  . Devices: Dolores Patty (started approx 2017)  Substance Use Topics  . Alcohol use: No  . Drug use: Not Currently    Types: Cocaine    Review of Systems Constitutional: No fever/chills Eyes: No visual changes. ENT: No sore throat.  Positive for difficulty swallowing Cardiovascular: Denies chest pain. Respiratory: Positive for shortness of breath. Gastrointestinal: No abdominal pain.  No nausea, no vomiting.  No diarrhea.  No constipation. Genitourinary: Negative for dysuria. Musculoskeletal: Negative for neck pain.  Negative for back pain. Integumentary: Positive for for rash. Neurological: Negative for headaches, focal weakness or numbness.  ____________________________________________   PHYSICAL EXAM:  VITAL SIGNS: ED Triage Vitals  Enc Vitals Group     BP 08/14/20 2324 105/87     Pulse Rate 08/14/20 2324 78     Resp 08/14/20 2324 14     Temp 08/14/20 2324 98.1 F (36.7 C)     Temp Source 08/14/20 2324 Oral     SpO2 08/14/20 2324 100 %     Weight 08/14/20 2325 76 kg (167 lb 8.8 oz)     Height 08/14/20 2325 1.753 m (5\' 9" )     Head Circumference --      Peak Flow --      Pain Score 08/14/20 2325 10     Pain Loc --      Pain  Edu? --      Excl. in GC? --     Constitutional: Alert and oriented.  Eyes: Conjunctivae are normal.  Head: Atraumatic. Mouth/Throat: Patient is wearing a mask. Neck: No stridor.  No meningeal signs.   Cardiovascular: Normal rate, regular rhythm. Good peripheral circulation. Grossly normal heart sounds. Respiratory: Normal respiratory effort.  No retractions. Gastrointestinal: Soft and nontender. No distention.  Musculoskeletal: No lower extremity tenderness nor edema. No gross deformities of extremities. Neurologic:  Normal speech and language. No gross focal neurologic deficits are appreciated.  Skin: Diffuse hives noted Psychiatric: Mood and affect are normal. Speech and behavior are normal.  ____________________________________________     Procedures   ____________________________________________   INITIAL IMPRESSION / MDM / ASSESSMENT AND PLAN / ED COURSE  As part of my medical decision making, I reviewed the following data within the electronic MEDICAL RECORD NUMBER  29 year old female presented with above-stated history and physical exam consistent with an acute  allergic reaction with findings consistent with anaphylaxis.  Patient given IM epinephrine, Benadryl, Pepcid and Solu-Medrol.  On reevaluation patient's hives completely resolved.  Patient requested gabapentin secondary to sciatic nerve pain which patient has had in the past.  Patient with no difficulty swallowing or breathing at present and as such will be discharged home with Keflex for reported urinary tract infection for which she was prescribed Cipro.  Patient also prescribed prednisone daily for the next 5 days.  Patient and her fianc notified that she should discontinue taking Cipro and never take any fluoroquinolones in the future.  ____________________________________________  FINAL CLINICAL IMPRESSION(S) / ED DIAGNOSES  Final diagnoses:  Allergic reaction, initial encounter  Anaphylaxis, initial encounter       MEDICATIONS GIVEN DURING THIS VISIT:  Medications  diphenhydrAMINE (BENADRYL) injection 25 mg (has no administration in time range)  EPINEPHrine (EPI-PEN) injection 0.3 mg (has no administration in time range)  methylPREDNISolone sodium succinate (SOLU-MEDROL) 125 mg/2 mL injection 125 mg (has no administration in time range)  famotidine (PEPCID) IVPB 20 mg premix (has no administration in time range)  sodium chloride 0.9 % bolus 1,000 mL (has no administration in time range)  EPINEPHrine (EPI-PEN) 0.3 mg/0.3 mL injection (has no administration in time range)     ED Discharge Orders    None      *Please note:  RABECCA BIRGE was evaluated in Emergency Department on 08/14/2020 for the symptoms described in the history of present illness. She was evaluated in the context of the global COVID-19 pandemic, which necessitated consideration that the patient might be at risk for infection with the SARS-CoV-2 virus that causes COVID-19. Institutional protocols and algorithms that pertain to the evaluation of patients at risk for COVID-19 are in a state of rapid change based on information released by regulatory bodies including the CDC and federal and state organizations. These policies and algorithms were followed during the patient's care in the ED.  Some ED evaluations and interventions may be delayed as a result of limited staffing during and after the pandemic.*  Note:  This document was prepared using Dragon voice recognition software and may include unintentional dictation errors.   Darci Current, MD 08/15/20 [phone]

## 2020-08-15 ENCOUNTER — Encounter: Payer: Self-pay | Admitting: Emergency Medicine

## 2020-08-15 MED ORDER — LORAZEPAM 2 MG/ML IJ SOLN
0.5000 mg | Freq: Once | INTRAMUSCULAR | Status: AC
  Administered 2020-08-15: 0.5 mg via INTRAVENOUS
  Filled 2020-08-15: qty 1

## 2020-08-15 MED ORDER — GABAPENTIN 100 MG PO CAPS
100.0000 mg | ORAL_CAPSULE | Freq: Once | ORAL | Status: AC
  Administered 2020-08-15: 100 mg via ORAL
  Filled 2020-08-15 (×2): qty 1

## 2020-08-15 MED ORDER — CEPHALEXIN 500 MG PO CAPS: 500.0000 mg | ORAL_CAPSULE | Freq: Two times a day (BID) | ORAL | 0 refills | Status: AC

## 2020-08-15 MED ORDER — PREDNISONE 20 MG PO TABS: 40.0000 mg | ORAL_TABLET | Freq: Every day | ORAL | 0 refills | Status: AC

## 2020-08-15 NOTE — ED Notes (Signed)
Discharge instructions reviewed with patient and significant other. Pt understood discharge instructions. Upon discharge patient was alert and oriented x 4, ambulatory, and denied sob

## 2020-08-15 NOTE — ED Notes (Signed)
MD at bedside. 

## 2020-08-24 ENCOUNTER — Other Ambulatory Visit: Payer: Self-pay

## 2020-08-24 ENCOUNTER — Encounter: Payer: Self-pay | Admitting: Emergency Medicine

## 2020-08-24 DIAGNOSIS — Z5321 Procedure and treatment not carried out due to patient leaving prior to being seen by health care provider: Secondary | ICD-10-CM | POA: Insufficient documentation

## 2020-08-24 DIAGNOSIS — K5900 Constipation, unspecified: Secondary | ICD-10-CM | POA: Insufficient documentation

## 2020-08-24 DIAGNOSIS — R1030 Lower abdominal pain, unspecified: Secondary | ICD-10-CM | POA: Diagnosis present

## 2020-08-24 LAB — COMPREHENSIVE METABOLIC PANEL
ALT: 14 U/L (ref 0–44)
AST: 16 U/L (ref 15–41)
Albumin: 4.2 g/dL (ref 3.5–5.0)
Alkaline Phosphatase: 53 U/L (ref 38–126)
Anion gap: 11 (ref 5–15)
BUN: 11 mg/dL (ref 6–20)
CO2: 24 mmol/L (ref 22–32)
Calcium: 9.2 mg/dL (ref 8.9–10.3)
Chloride: 109 mmol/L (ref 98–111)
Creatinine, Ser: 0.64 mg/dL (ref 0.44–1.00)
GFR, Estimated: >60 mL/min (ref >60)
Glucose, Bld: 110 mg/dL — ABNORMAL HIGH (ref 70–99)
Potassium: 3.6 mmol/L (ref 3.5–5.1)
Sodium: 144 mmol/L (ref 135–145)
Total Bilirubin: 0.5 mg/dL (ref 0.3–1.2)
Total Protein: 8 g/dL (ref 6.5–8.1)

## 2020-08-24 LAB — LIPASE, BLOOD: Lipase: 36 U/L (ref 11–51)

## 2020-08-24 LAB — CBC
HCT: 40.5 % (ref 36.0–46.0)
Hemoglobin: 13 g/dL (ref 12.0–15.0)
MCH: 27.8 pg (ref 26.0–34.0)
MCHC: 32.1 g/dL (ref 30.0–36.0)
MCV: 86.5 fL (ref 80.0–100.0)
Platelets: 335 10*3/uL (ref 150–400)
RBC: 4.68 MIL/uL (ref 3.87–5.11)
RDW: 14.4 % (ref 11.5–15.5)
WBC: 8.6 10*3/uL (ref 4.0–10.5)
nRBC: 0 % (ref 0.0–0.2)

## 2020-08-24 MED ORDER — ONDANSETRON 4 MG PO TBDP: 4.0000 mg | ORAL_TABLET | Freq: Once | ORAL | Status: DC | PRN

## 2020-08-24 NOTE — ED Triage Notes (Signed)
Pt arrived via POV with reports of constipation x 1 week.  Pt reports she is able to go some but states it is straight water.  Pt also c/o pain lower abdominal pain and vomiting.  Pt denies any previous surgeries to abdomen, but states she has hx of IBS-C and states she has had colitis before.

## 2020-08-25 ENCOUNTER — Emergency Department
Admission: EM | Admit: 2020-08-25 | Discharge: 2020-08-25 | Disposition: A | Discharge status: Home / Self Care | Payer: Medicaid Other | Attending: Emergency Medicine | Admitting: Emergency Medicine

## 2020-08-25 NOTE — ED Notes (Signed)
Pt called x's 3, no response ?

## 2020-08-25 NOTE — ED Triage Notes (Signed)
Pt called from [facility] to treatment room, no response 

## 2020-08-25 NOTE — ED Triage Notes (Signed)
Pt called from [facility] to response

## 2020-09-08 ENCOUNTER — Telehealth: Payer: Self-pay

## 2020-09-08 NOTE — Telephone Encounter (Signed)
Pt lvm requesting Dr. Johnney Killian advice. Pt states she's been vomiting and feels she's having issues with her IBS and wants to know what she should do.

## 2020-09-08 NOTE — Telephone Encounter (Signed)
Spoke with pt and informed her of Dr. Johnney Killian recommendation. Pt agrees but prefers to be referred to a GI provider in [place] as she is not able to travel to the [facility] or [place] areas. We have sent the referral to [facility] in [place] as requested by pt.

## 2020-09-08 NOTE — Telephone Encounter (Signed)
Inform her that I have tried multiple options with no significant success.  Would she be interested with for a second opinion at [facility].  If she is please refer her

## 2021-07-07 ENCOUNTER — Other Ambulatory Visit: Payer: Self-pay | Admitting: Physical Medicine and Rehabilitation

## 2021-07-07 DIAGNOSIS — M5459 Other low back pain: Secondary | ICD-10-CM

## 2021-08-03 ENCOUNTER — Encounter: Payer: Self-pay | Admitting: *Deleted

## 2021-08-04 ENCOUNTER — Encounter: Payer: Self-pay | Admitting: Neurology

## 2021-08-04 ENCOUNTER — Telehealth: Payer: Self-pay | Admitting: *Deleted

## 2021-08-04 ENCOUNTER — Ambulatory Visit: Payer: Medicaid Other | Admitting: Neurology

## 2021-08-04 ENCOUNTER — Telehealth: Payer: Self-pay | Admitting: Neurology

## 2021-08-04 NOTE — Telephone Encounter (Signed)
Regina [name] no showed her new patient appointment today neurology.  If she calls back she can be rescheduled with any physician however please remind her that we have many patients waiting for new appointments and that if she does reschedule please have the intention to come to your appointment and another no-show or cancellation may result in dismissal from our practice per office policy.

## 2021-08-04 NOTE — Telephone Encounter (Signed)
Pt no showed for appt today.  

## 2021-08-06 ENCOUNTER — Other Ambulatory Visit: Payer: Medicaid Other

## 2021-08-14 ENCOUNTER — Ambulatory Visit
Admission: RE | Admit: 2021-08-14 | Discharge: 2021-08-14 | Disposition: A | Discharge status: Home / Self Care | Payer: Medicaid Other | Source: Ambulatory Visit | Attending: Physical Medicine and Rehabilitation | Admitting: Physical Medicine and Rehabilitation

## 2021-08-14 DIAGNOSIS — M5459 Other low back pain: Secondary | ICD-10-CM

## 2021-09-22 ENCOUNTER — Other Ambulatory Visit: Payer: Self-pay

## 2021-09-22 ENCOUNTER — Emergency Department
Admission: EM | Admit: 2021-09-22 | Discharge: 2021-09-22 | Disposition: A | Discharge status: Home / Self Care | Payer: Medicaid Other | Attending: Emergency Medicine | Admitting: Emergency Medicine

## 2021-09-22 DIAGNOSIS — M5442 Lumbago with sciatica, left side: Secondary | ICD-10-CM | POA: Insufficient documentation

## 2021-09-22 DIAGNOSIS — M5432 Sciatica, left side: Secondary | ICD-10-CM

## 2021-09-22 DIAGNOSIS — K529 Noninfective gastroenteritis and colitis, unspecified: Secondary | ICD-10-CM | POA: Insufficient documentation

## 2021-09-22 DIAGNOSIS — R000 Tachycardia, unspecified: Secondary | ICD-10-CM | POA: Diagnosis not present

## 2021-09-22 DIAGNOSIS — R109 Unspecified abdominal pain: Secondary | ICD-10-CM | POA: Diagnosis present

## 2021-09-22 LAB — COMPREHENSIVE METABOLIC PANEL
ALT: 16 U/L (ref 0–44)
AST: 21 U/L (ref 15–41)
Albumin: 3.8 g/dL (ref 3.5–5.0)
Alkaline Phosphatase: 61 U/L (ref 38–126)
Anion gap: 7 (ref 5–15)
BUN: 14 mg/dL (ref 6–20)
CO2: 26 mmol/L (ref 22–32)
Calcium: 8.9 mg/dL (ref 8.9–10.3)
Chloride: 103 mmol/L (ref 98–111)
Creatinine, Ser: 0.62 mg/dL (ref 0.44–1.00)
GFR, Estimated: >60 mL/min (ref >60)
Glucose, Bld: 95 mg/dL (ref 70–99)
Potassium: 4.2 mmol/L (ref 3.5–5.1)
Sodium: 136 mmol/L (ref 135–145)
Total Bilirubin: 0.4 mg/dL (ref 0.3–1.2)
Total Protein: 7.5 g/dL (ref 6.5–8.1)

## 2021-09-22 LAB — CBC
HCT: 36.6 % (ref 36.0–46.0)
Hemoglobin: 11.1 g/dL — ABNORMAL LOW (ref 12.0–15.0)
MCH: 26.7 pg (ref 26.0–34.0)
MCHC: 30.3 g/dL (ref 30.0–36.0)
MCV: 88.2 fL (ref 80.0–100.0)
Platelets: 275 10*3/uL (ref 150–400)
RBC: 4.15 MIL/uL (ref 3.87–5.11)
RDW: 14.6 % (ref 11.5–15.5)
WBC: 7.2 10*3/uL (ref 4.0–10.5)
nRBC: 0 % (ref 0.0–0.2)

## 2021-09-22 LAB — URINALYSIS, COMPLETE (UACMP) WITH MICROSCOPIC
Bacteria, UA: NONE SEEN
Bilirubin Urine: NEGATIVE
Glucose, UA: NEGATIVE mg/dL
Hgb urine dipstick: NEGATIVE
Ketones, ur: NEGATIVE mg/dL
Leukocytes,Ua: NEGATIVE
Nitrite: NEGATIVE
Protein, ur: NEGATIVE mg/dL
Specific Gravity, Urine: 1.006 (ref 1.005–1.030)
pH: 8 (ref 5.0–8.0)

## 2021-09-22 LAB — GASTROINTESTINAL PANEL BY PCR, STOOL (REPLACES STOOL CULTURE)

## 2021-09-22 LAB — C DIFFICILE QUICK SCREEN W PCR REFLEX
C Diff antigen: NEGATIVE
C Diff interpretation: NOT DETECTED
C Diff toxin: NEGATIVE

## 2021-09-22 LAB — TYPE AND SCREEN
ABO/RH(D): O POS
Antibody Screen: NEGATIVE

## 2021-09-22 LAB — LIPASE, BLOOD: Lipase: 42 U/L (ref 11–51)

## 2021-09-22 MED ORDER — DICYCLOMINE HCL 10 MG PO CAPS: 10.0000 mg | ORAL_CAPSULE | Freq: Three times a day (TID) | ORAL | 0 refills | Status: DC

## 2021-09-22 MED ORDER — ONDANSETRON HCL 4 MG PO TABS: 4.0000 mg | ORAL_TABLET | Freq: Three times a day (TID) | ORAL | 0 refills | Status: AC | PRN

## 2021-09-22 MED ORDER — LIDOCAINE 5 % EX PTCH
1.0000 | MEDICATED_PATCH | CUTANEOUS | Status: DC
  Administered 2021-09-22: 1 via TRANSDERMAL
  Filled 2021-09-22: qty 1

## 2021-09-22 MED ORDER — ONDANSETRON HCL 4 MG PO TABS: 4.0000 mg | ORAL_TABLET | Freq: Three times a day (TID) | ORAL | 0 refills | Status: DC | PRN

## 2021-09-22 MED ORDER — ONDANSETRON 4 MG PO TBDP
4.0000 mg | ORAL_TABLET | Freq: Once | ORAL | Status: AC
  Administered 2021-09-22: 4 mg via ORAL
  Filled 2021-09-22: qty 1

## 2021-09-22 MED ORDER — SUCRALFATE 1 G PO TABS
1.0000 g | ORAL_TABLET | Freq: Once | ORAL | Status: AC
  Administered 2021-09-22: 1 g via ORAL
  Filled 2021-09-22: qty 1

## 2021-09-22 MED ORDER — DICYCLOMINE HCL 10 MG PO CAPS
10.0000 mg | ORAL_CAPSULE | Freq: Once | ORAL | Status: AC
  Administered 2021-09-22: 10 mg via ORAL
  Filled 2021-09-22: qty 1

## 2021-09-22 NOTE — ED Triage Notes (Addendum)
Pt c/o lower abd pain that radiates into the lower back since last night with blood in emesis and loose stools, states the pain is burning in nature.. pt is in NAD on arrival.

## 2021-09-22 NOTE — ED Notes (Signed)
Pregnancy test NEG. 

## 2021-09-22 NOTE — ED Provider Notes (Signed)
[facility] Emergency Department Provider Note  ____________________________________________   Event Date/Time   First MD Initiated Contact with Patient 09/22/21 [phone]     (approximate)  I have reviewed the triage vital signs and the nursing notes.   HISTORY  Chief Complaint Abdominal Pain   HPI Regina [name] is a 30 y.o. female with a past medical history of ADHD, anemia, etc., arthritis, chronic back pain, GERD, colitis who presents for assessment of some nausea and vomiting with some blood-streaked sputum as well as some crampy generalized abdominal pain and diarrhea starting yesterday.  Patient also like her lower back pain is worse than usual although still radiates on her left leg as it usually does.  She has been taking Goody powders for abdominal discomfort but this has not been helping much.  She denies any significant chest pain, shortness of breath, earache, sore throat, vertigo, upper back pain, rash or extremity pain.  No burning with urination, vaginal bleeding or discharge or any other clear acute associated sick symptoms.  Denies any significant NSAID or EtOH use.     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  She states she has been taking hyoscyamine but does not feel this was helping her colitis.)         Past Medical History:  Diagnosis Date   ADHD (attention deficit hyperactivity disorder)    Anemia    Anxiety    Arthritis    Chronic back pain    Colitis    GERD (gastroesophageal reflux disease)    Migraine headache    about 1x/week since starting Ajovy (post concussion syndome)   PONV (postoperative nausea and vomiting)    Scoliosis     Patient Active Problem List   Diagnosis Date Noted   Intractable migraine with aura without status migrainosus  02/14/2019   Seizure-like activity ([place]) 09/04/2018   Headache disorder 04/12/2018   Post concussion syndrome 04/12/2018    Past Surgical History:  Procedure Laterality Date   BREAST SURGERY Right    Cyst removal   COLONOSCOPY WITH PROPOFOL N/A 10/23/2018   Procedure: COLONOSCOPY WITH PROPOFOL;  Surgeon: Jonathon Bellows, MD;  Location: [facility] ENDOSCOPY;  Service: Gastroenterology;  Laterality: N/A;   SPHINCTEROTOMY N/A 01/22/2020   Procedure: Chemical SPHINCTEROTOMY w/Botox;  Surgeon: Jules Husbands, MD;  Location: [facility] ORS;  Service: General;  Laterality: N/A;   TONSILLECTOMY Bilateral 10/09/2019   Procedure: TONSILLECTOMY;  Surgeon: Clyde Canterbury, MD;  Location: [place];  Service: ENT;  Laterality: Bilateral;    Prior to Admission medications   Medication Sig Start Date End Date Taking? Authorizing Provider  dicyclomine (BENTYL) 10 MG capsule Take 1 capsule (10 mg total) by mouth 4 (four) times daily -  before meals and at bedtime for 5 days. 09/22/21 09/27/21 Yes Lucrezia Starch, MD  ondansetron (ZOFRAN) 4 MG tablet Take 1 tablet (4 mg total) by mouth every 8 (eight) hours as needed for up to 10 doses for nausea or vomiting. 09/22/21  Yes Lucrezia Starch, MD  albuterol (VENTOLIN HFA) 108 (90 Base) MCG/ACT inhaler Inhale 2 puffs into the lungs every 6 (six) hours as needed for wheezing or shortness of breath. 05/18/20   Fisher, Linden Dolin, PA-C  ALPRAZolam Duanne Moron) 0.25 MG tablet TAKE 1 TABLET (0.25 MG) BY MOUTH  ONCE A DAY AS NEEDED ONLY FOR PANIC ATTACKS 03/12/21   [provider]  baclofen (LIORESAL) 10 MG tablet Take 10 mg by mouth 2 (two) times daily.    [provider]  butalbital-acetaminophen-caffeine (FIORICET) 50-325-40 MG tablet butalbital-acetaminophen-caffeine 50 mg-325 mg-40 mg tablet  TAKE 1 TABLET BY MOUTH 2 (TWO) TIMES DAILY AS NEEDED FOR HEADACHE 07/18/20   [provider]  citalopram (CELEXA) 10 MG tablet TAKE 1 TABLET (10 MG) BY MOUTH DAILY.  02/16/21   [provider]  famotidine (PEPCID) 40 MG tablet TAKE 1 (ONE) TABLET AS NEEDED FOR BREAKTHROUGH HEARTBURN 02/26/21   [provider]  fluticasone (FLONASE) 50 MCG/ACT nasal spray Place 2 sprays into both nostrils daily.  09/10/19   [provider]  fluticasone (FLONASE) 50 MCG/ACT nasal spray Place into the nose. 07/06/20   [provider]  Fremanezumab-vfrm (AJOVY) 225 MG/1.5ML SOAJ Inject 225 mg into the skin every 30 (thirty) days.     [provider]  hydrocortisone 2.5 % cream APPLY TOPICALLY TWO TIMES A DAY AS NEEDED FOR IRRITATION OR RASH. 03/06/21   [provider]  hydrOXYzine (VISTARIL) 25 MG capsule TAKE 1 CAPSULES BY MOUTH EVERY 6 HOURS AS NEEDED FOR ANXIETY 03/18/21   [provider]  hyoscyamine (LEVBID) 0.375 MG 12 hr tablet hyoscyamine ER 0.375 mg tablet,extended release,12 hr  1 (ONE) TABLET TWICE DAILY FOR ABDOMINAL PAIN 02/19/21   [provider]  norethindrone-ethinyl estradiol-FE (LOESTRIN FE) 1-20 MG-MCG tablet Take 1 tablet by mouth daily. 03/16/21   [provider]  nystatin (NYSTATIN) powder Apply 1 application topically 2 (two) times daily. 02/27/20   Pabon, Diego F, MD  omeprazole (PRILOSEC) 40 MG capsule Take 1 capsule (40 mg total) by mouth 2 (two) times daily. 06/24/20 09/22/20  Jonathon Bellows, MD  ondansetron (ZOFRAN-ODT) 4 MG disintegrating tablet     [provider]  polyethylene glycol (MIRALAX / GLYCOLAX) 17 g packet Take 17 g by mouth 2 (two) times daily.    [provider]  PRESCRIPTION MEDICATION Place 1 application rectally in the morning and at bedtime. nifedipine ointment 30 mg    [provider]  Probiotic Product (PROBIOTIC PO) Take 1 tablet by mouth daily.     [provider]  rizatriptan (MAXALT-MLT) 10 MG disintegrating tablet Take 10 mg by mouth as needed for migraine.  04/02/19   [provider]  sucralfate (CARAFATE) 1 g tablet  sucralfate 1 gram tablet  TAKE 1 (ONE) TABLET 30 MIN BEFORE MEALS AND BEDTIME 10/10/20   [provider]  trolamine salicylate (ASPERCREME) 10 % cream Apply 1 application topically daily as needed (on feet).    [provider]    Allergies Ciprofloxacin-ciproflox hcl er, Sulfa antibiotics, Other, and Rizatriptan  No family history on file.  Social History Social History   Tobacco Use   Smoking status: Never   Smokeless tobacco: Never  Vaping Use   Vaping Use: Every day   Substances: Nicotine, CBD   Devices: Freemax (started approx 2017)  Substance Use Topics   Alcohol use: No   Drug use: Not Currently    Types: Cocaine    Review of Systems  Review of Systems  Constitutional:  Negative for chills and fever.  HENT:  Negative for sore throat.   Eyes:  Negative for pain.  Respiratory:  Negative for cough and stridor.   Cardiovascular:  Negative for chest pain.  Gastrointestinal:  Positive for abdominal pain, blood in stool, diarrhea,  nausea and vomiting.  Genitourinary:  Negative for dysuria.  Musculoskeletal:  Negative for myalgias.  Skin:  Negative for rash.  Neurological:  Negative for seizures, loss of consciousness and headaches.  Psychiatric/Behavioral:  Negative for suicidal ideas.   All other systems reviewed and are negative.    ____________________________________________   PHYSICAL EXAM:  VITAL SIGNS: ED Triage Vitals  Enc Vitals Group     BP 09/22/21 0755 115/83     Pulse Rate 09/22/21 0755 (!) 104     Resp 09/22/21 0755 18     Temp 09/22/21 0755 97.9 F (36.6 C)     Temp Source 09/22/21 0755 Oral     SpO2 09/22/21 0755 97 %     Weight 09/22/21 0750 180 lb (81.6 kg)     Height 09/22/21 0750 5\' 9"  (1.753 m)     Head Circumference --      Peak Flow --      Pain Score 09/22/21 0750 10     Pain Loc --      Pain Edu? --      Excl. in [place]? --    Vitals:   09/22/21 0755 09/22/21 0842  BP: 115/83 109/74  Pulse: (!) 104 90  Resp:  18 16  Temp: 97.9 F (36.6 C)   SpO2: 97% 96%   Physical Exam Vitals and nursing note reviewed.  Constitutional:      General: She is not in acute distress.    Appearance: She is well-developed.  HENT:     Head: Normocephalic and atraumatic.     Right Ear: External ear normal.     Left Ear: External ear normal.     Nose: Nose normal.     Mouth/Throat:     Mouth: Mucous membranes are moist.  Eyes:     Conjunctiva/sclera: Conjunctivae normal.  Cardiovascular:     Rate and Rhythm: Regular rhythm. Tachycardia present.     Heart sounds: No murmur heard. Pulmonary:     Effort: Pulmonary effort is normal. No respiratory distress.     Breath sounds: Normal breath sounds.  Abdominal:     General: There is no distension.     Palpations: Abdomen is soft.     Tenderness: There is abdominal tenderness (mildly throughout).  Musculoskeletal:     Cervical back: Neck supple.  Skin:    General: Skin is warm and dry.     Capillary Refill: Capillary refill takes less than 2 seconds.  Neurological:     Mental Status: She is alert and oriented to person, place, and time.  Psychiatric:        Mood and Affect: Mood normal.    Positive left-sided straight leg test.  Patient otherwise with full strength and sensation throughout the bilateral lower extremities.  Has some mild left-sided paralumbar muscle tenderness without any midline tenderness or overlying skin changes. ____________________________________________   LABS (all labs ordered are listed, but only abnormal results are displayed)  Labs Reviewed  CBC - Abnormal; Notable for the following components:      Result Value   Hemoglobin 11.1 (*)    All other components within normal limits  URINALYSIS, COMPLETE (UACMP) WITH MICROSCOPIC - Abnormal; Notable for the following components:   Color, Urine STRAW (*)    APPearance HAZY (*)    All other components within normal limits  GASTROINTESTINAL PANEL BY PCR, STOOL (REPLACES STOOL  CULTURE)  C DIFFICILE QUICK SCREEN W PCR REFLEX    COMPREHENSIVE METABOLIC PANEL  LIPASE,  BLOOD  H. PYLORI ANTIGEN, STOOL  POC URINE PREG, ED  POC OCCULT BLOOD, ED  TYPE AND SCREEN   ____________________________________________  EKG  ECG remarkable sinus rhythm with ventricular rate of 85, normal axis, unremarkable intervals without evidence of ischemia or significant arrhythmia.  Isolated nonspecific change in V3. ____________________________________________  RADIOLOGY  ED MD interpretation:   Official radiology report(s): No results found.  ____________________________________________   PROCEDURES  Procedure(s) performed (including Critical Care):  Procedures   ____________________________________________   INITIAL IMPRESSION / ASSESSMENT AND PLAN / ED COURSE        Presents with above-stated history exam for assessment of some acute on chronic generalized abdominal pain associate with some nonbloody diarrhea and some vomiting with blood streaks in it.  She also states that her back pain has been acting up more than usual.  On arrival she is tachycardic with otherwise stable vital signs on room air.  She has some mild tenderness at her abdomen but no significant tenderness on deep palpation and is not peritonitis and has no guarding.  She has no CVA tenderness.  There is some very mild left lower lumbar muscle tenderness and a positive left-sided right leg test but otherwise patient is neurovascularly intact in her lower extremities and able to ambulate unassisted.  I suspect patient's GI symptoms are likely related to possible autoimmune colitis flare versus a infectious gastroenteritis versus possible gastritis from patient's Goody powders over the last couple days.  I Think this is likely unrelated to the left lower back pain which is most consistent with sciatica.  She states left lower back pain feel exactly like her prior sciatica pain in the past.  She has no  focal tenderness in the right upper quadrant right lower quadrant or fever or leukocytosis or other findings on her CMP to suggest cholecystitis, acute hepatitis or pancreatitis.  Given overall presentation I have a much lower suspicion for diverticulitis and patient has fairly benign abdominal exam low suspicion for abscess or perforation.  CMP otherwise shows no evidence of kidney injury, acidosis, electrolyte derangements or cholestasis.  Pregnancy test is negative.  UA not suggestive of cystitis.  Lipase not consistent with acute pancreatitis.  She is denying any pelvic discomfort discharge or bleeding and has no fevers or leukocytosis to suggest PID or pelvic infection at this time.  She states she is already on gabapentin and does not wish to try this for her back pain.  Given GI cocktail. states she cannot take any antiemetic medications because they are bad for her heart.  I did obtain EKG which shows normal QTc interval and I do not see any other contraindication to Zofran which was offered and she was able to take this.  She subsequently tolerated p.o. without difficulty..  Was given a lidocaine patch for her back given she is already on gabapentin and I do not think NSAIDs are appropriate given concern for gastritis will defer any additional pain medications at this time.  Patient was able to give a stool sample which was sent for GI pathogen C. difficile and H. pylori studies.  I think this can be followed up outpatient given patient states she does not have a GI doctor can see her I will give her clinic information so she can schedule follow-up visit with [facility].  She is tolerating p.o. and given stable vitals with  reassuring exam and work-up with low suspicion for immediate life-threatening pathology I think she is stable for discharge with continued  outpatient evaluation.  Discharged stable condition.  Strict return precautions advised and discussed.  Rx written for Zofran and Bentyl.      ____________________________________________   FINAL CLINICAL IMPRESSION(S) / ED DIAGNOSES  Final diagnoses:  Gastroenteritis  Sciatica of left side    Medications  lidocaine (LIDODERM) 5 % 1 patch (1 patch Transdermal Patch Applied 09/22/21 1010)  dicyclomine (BENTYL) capsule 10 mg (10 mg Oral Given 09/22/21 1010)  sucralfate (CARAFATE) tablet 1 g (1 g Oral Given 09/22/21 1010)  ondansetron (ZOFRAN-ODT) disintegrating tablet 4 mg (4 mg Oral Given 09/22/21 1010)     ED Discharge Orders          Ordered    ondansetron (ZOFRAN) 4 MG tablet  Every 8 hours PRN        09/22/21 1048    dicyclomine (BENTYL) 10 MG capsule  3 times daily before meals & bedtime        09/22/21 1048             Note:  This document was prepared using Dragon voice recognition software and may include unintentional dictation errors.    Lucrezia Starch, MD 09/22/21 1052

## 2021-09-24 LAB — H. PYLORI ANTIGEN, STOOL: H. Pylori Stool Ag, Eia: NEGATIVE

## 2021-10-15 ENCOUNTER — Encounter: Payer: Self-pay | Admitting: *Deleted

## 2021-10-19 ENCOUNTER — Encounter: Payer: Self-pay | Admitting: Diagnostic Neuroimaging

## 2021-10-19 ENCOUNTER — Ambulatory Visit: Payer: Medicaid Other | Admitting: Neurology

## 2021-10-20 ENCOUNTER — Encounter: Payer: Self-pay | Admitting: Neurology

## 2021-10-21 ENCOUNTER — Other Ambulatory Visit: Payer: Self-pay

## 2021-10-21 ENCOUNTER — Emergency Department
Admission: EM | Admit: 2021-10-21 | Discharge: 2021-10-22 | Disposition: A | Discharge status: Home / Self Care | Payer: Medicaid Other | Attending: Emergency Medicine | Admitting: Emergency Medicine

## 2021-10-21 DIAGNOSIS — G43909 Migraine, unspecified, not intractable, without status migrainosus: Secondary | ICD-10-CM | POA: Diagnosis not present

## 2021-10-21 DIAGNOSIS — R002 Palpitations: Secondary | ICD-10-CM | POA: Insufficient documentation

## 2021-10-21 DIAGNOSIS — R519 Headache, unspecified: Secondary | ICD-10-CM | POA: Diagnosis present

## 2021-10-21 LAB — COMPREHENSIVE METABOLIC PANEL
ALT: 14 U/L (ref 0–44)
AST: 19 U/L (ref 15–41)
Albumin: 4.5 g/dL (ref 3.5–5.0)
Alkaline Phosphatase: 62 U/L (ref 38–126)
Anion gap: 7 (ref 5–15)
BUN: 9 mg/dL (ref 6–20)
CO2: 27 mmol/L (ref 22–32)
Calcium: 9.3 mg/dL (ref 8.9–10.3)
Chloride: 103 mmol/L (ref 98–111)
Creatinine, Ser: 0.5 mg/dL (ref 0.44–1.00)
GFR, Estimated: >60 mL/min (ref >60)
Glucose, Bld: 98 mg/dL (ref 70–99)
Potassium: 3.7 mmol/L (ref 3.5–5.1)
Sodium: 137 mmol/L (ref 135–145)
Total Bilirubin: 0.4 mg/dL (ref 0.3–1.2)
Total Protein: 7.6 g/dL (ref 6.5–8.1)

## 2021-10-21 LAB — CBC
HCT: 38.5 % (ref 36.0–46.0)
Hemoglobin: 11.9 g/dL — ABNORMAL LOW (ref 12.0–15.0)
MCH: 27.2 pg (ref 26.0–34.0)
MCHC: 30.9 g/dL (ref 30.0–36.0)
MCV: 88.1 fL (ref 80.0–100.0)
Platelets: 329 10*3/uL (ref 150–400)
RBC: 4.37 MIL/uL (ref 3.87–5.11)
RDW: 14.6 % (ref 11.5–15.5)
WBC: 8.2 10*3/uL (ref 4.0–10.5)
nRBC: 0 % (ref 0.0–0.2)

## 2021-10-21 LAB — TROPONIN I (HIGH SENSITIVITY): Troponin I (High Sensitivity): <2 ng/L (ref <18)

## 2021-10-21 MED ORDER — SODIUM CHLORIDE 0.9 % IV BOLUS
500.0000 mL | Freq: Once | INTRAVENOUS | Status: AC
  Administered 2021-10-21: 500 mL via INTRAVENOUS

## 2021-10-21 MED ORDER — DIPHENHYDRAMINE HCL 50 MG/ML IJ SOLN
12.5000 mg | INTRAMUSCULAR | Status: AC
  Administered 2021-10-21: 23:00:00 12.5 mg via INTRAVENOUS
  Filled 2021-10-21: qty 1

## 2021-10-21 MED ORDER — KETOROLAC TROMETHAMINE 30 MG/ML IJ SOLN
15.0000 mg | Freq: Once | INTRAMUSCULAR | Status: AC
  Administered 2021-10-21: 23:00:00 15 mg via INTRAVENOUS
  Filled 2021-10-21: qty 1

## 2021-10-21 MED ORDER — DEXAMETHASONE SODIUM PHOSPHATE 10 MG/ML IJ SOLN
10.0000 mg | Freq: Once | INTRAMUSCULAR | Status: AC
  Administered 2021-10-21: 23:00:00 10 mg via INTRAVENOUS
  Filled 2021-10-21: qty 1

## 2021-10-21 MED ORDER — DROPERIDOL 2.5 MG/ML IJ SOLN
2.5000 mg | Freq: Once | INTRAMUSCULAR | Status: AC
  Administered 2021-10-21: 23:00:00 2.5 mg via INTRAVENOUS
  Filled 2021-10-21: qty 2

## 2021-10-21 NOTE — ED Notes (Addendum)
Called pts husband on number on file.  States "pt wanted to leave".  Advised to return for repeat labwork.  They agreed and said they would return.

## 2021-10-21 NOTE — ED Notes (Addendum)
Patient ambulatory to triage with steady gait, without difficulty or distress noted; pt voices understanding of required redraw of labs

## 2021-10-21 NOTE — ED Notes (Addendum)
Lab reports several abnormal labs noted on metabolic panel, requests redraw; No answer when called several times in lobby; called phone # listed in chart with no answer; message left on voicemail to return call

## 2021-10-21 NOTE — ED Provider Notes (Signed)
[facility] Emergency Department Provider Note  ____________________________________________   Event Date/Time   First MD Initiated Contact with Patient 10/21/21 2258     (approximate)  I have reviewed the triage vital signs and the nursing notes.   HISTORY  Chief Complaint Palpitations and Migraine    HPI Regina [name] is a 30 y.o. female with medical history as listed below who presents for evaluation of a right-sided headache that has been present for about 3 days.  She says it feels worse than her usual migraines.  She feels a stabbing pain behind her right eye as well as a sharp pain in the right side of her head.  She gets prescriptions for Vicodin from her orthopedic provider and that does not seem to help.  She has also tried Excedrin.  Nothing in particular makes it better and bright lights and loud noises makes it worse.  It is been accompanied with nausea and vomiting and she is that she has not been able to eat or drink very much over the last few days as well.  No focal numbness nor weakness.  She reports that the pain is severe and was gradual in onset.  She states that she goes to a headache clinic in [place] and that they give her "migraine shots" but she says that the migraine shots make her feel worse.  She has an appointment tomorrow at the headache clinic but she said that she could not wait that long and she does not want the shot they are going to give her anyway.     Past Medical History:  Diagnosis Date   ADHD (attention deficit hyperactivity disorder)    Anemia    Anxiety    Arthritis    Chronic back pain    Colitis    GERD (gastroesophageal reflux disease)    Migraine headache    about 1x/week since starting Ajovy (post concussion syndome)   PONV (postoperative nausea and vomiting)    Scoliosis     Patient Active Problem List   Diagnosis Date Noted   Intractable migraine with aura without status migrainosus 02/14/2019    Seizure-like activity ([place]) 09/04/2018   Headache disorder 04/12/2018   Post concussion syndrome 04/12/2018    Past Surgical History:  Procedure Laterality Date   BREAST SURGERY Right    Cyst removal   COLONOSCOPY WITH PROPOFOL N/A 10/23/2018   Procedure: COLONOSCOPY WITH PROPOFOL;  Surgeon: Jonathon Bellows, MD;  Location: [facility] ENDOSCOPY;  Service: Gastroenterology;  Laterality: N/A;   SPHINCTEROTOMY N/A 01/22/2020   Procedure: Chemical SPHINCTEROTOMY w/Botox;  Surgeon: Jules Husbands, MD;  Location: [facility] ORS;  Service: General;  Laterality: N/A;   TONSILLECTOMY Bilateral 10/09/2019   Procedure: TONSILLECTOMY;  Surgeon: Clyde Canterbury, MD;  Location: [place];  Service: ENT;  Laterality: Bilateral;    Prior to Admission medications   Medication Sig Start Date End Date Taking? Authorizing Provider  albuterol (VENTOLIN HFA) 108 (90 Base) MCG/ACT inhaler Inhale 2 puffs into the lungs every 6 (six) hours as needed for wheezing or shortness of breath. 05/18/20   Fisher, Linden Dolin, PA-C  ALPRAZolam (XANAX) 0.25 MG tablet TAKE 1 TABLET (0.25 MG) BY MOUTH ONCE A DAY AS NEEDED ONLY FOR PANIC ATTACKS 03/12/21   [provider]  baclofen (LIORESAL) 10 MG tablet Take 10 mg by mouth 2 (two) times daily.    [provider]  butalbital-acetaminophen-caffeine (FIORICET) 50-325-40 MG tablet butalbital-acetaminophen-caffeine 50 mg-325 mg-40 mg tablet  TAKE 1  TABLET BY MOUTH 2 (TWO) TIMES DAILY AS NEEDED FOR HEADACHE 07/18/20   [provider]  citalopram (CELEXA) 10 MG tablet TAKE 1 TABLET (10 MG) BY MOUTH DAILY. 02/16/21   [provider]  diclofenac (VOLTAREN) 50 MG EC tablet Take 50 mg by mouth in the morning, at noon, and at bedtime.    [provider]  dicyclomine (BENTYL) 10 MG capsule Take 1 capsule (10 mg total) by mouth 4 (four) times daily -  before meals and at bedtime for 5 days. 09/22/21 09/27/21  Gilles ChiquitoSmith, Zachary P, MD  famotidine (PEPCID) 40 MG tablet  TAKE 1 (ONE) TABLET AS NEEDED FOR BREAKTHROUGH HEARTBURN 02/26/21   [provider]  fluticasone (FLONASE) 50 MCG/ACT nasal spray Place 2 sprays into both nostrils daily.  09/10/19   [provider]  fluticasone (FLONASE) 50 MCG/ACT nasal spray Place into the nose. 07/06/20   [provider]  Fremanezumab-vfrm (AJOVY) 225 MG/1.5ML SOAJ Inject 225 mg into the skin every 30 (thirty) days.     [provider]  hydrocortisone 2.5 % cream APPLY TOPICALLY TWO TIMES A DAY AS NEEDED FOR IRRITATION OR RASH. 03/06/21   [provider]  hydrOXYzine (VISTARIL) 25 MG capsule TAKE 1 CAPSULES BY MOUTH EVERY 6 HOURS AS NEEDED FOR ANXIETY 03/18/21   [provider]  hyoscyamine (LEVBID) 0.375 MG 12 hr tablet hyoscyamine ER 0.375 mg tablet,extended release,12 hr  1 (ONE) TABLET TWICE DAILY FOR ABDOMINAL PAIN 02/19/21   [provider]  norethindrone-ethinyl estradiol-FE (LOESTRIN FE) 1-20 MG-MCG tablet Take 1 tablet by mouth daily. 03/16/21   [provider]  nystatin (NYSTATIN) powder Apply 1 application topically 2 (two) times daily. 02/27/20   Pabon, Diego F, MD  omeprazole (PRILOSEC) 40 MG capsule Take 1 capsule (40 mg total) by mouth 2 (two) times daily. 06/24/20 09/22/20  Wyline MoodAnna, Kiran, MD  ondansetron (ZOFRAN) 4 MG tablet Take 1 tablet (4 mg total) by mouth every 8 (eight) hours as needed for up to 10 doses for nausea or vomiting. 09/22/21   Gilles ChiquitoSmith, Zachary P, MD  ondansetron (ZOFRAN-ODT) 4 MG disintegrating tablet     [provider]  polyethylene glycol (MIRALAX / GLYCOLAX) 17 g packet Take 17 g by mouth 2 (two) times daily.    [provider]  PRESCRIPTION MEDICATION Place 1 application rectally in the morning and at bedtime. nifedipine ointment 30 mg    [provider]  Probiotic Product (PROBIOTIC PO) Take 1 tablet by mouth daily.     [provider]  rizatriptan (MAXALT-MLT) 10 MG disintegrating tablet Take 10  mg by mouth as needed for migraine.  04/02/19   [provider]  sucralfate (CARAFATE) 1 g tablet sucralfate 1 gram tablet  TAKE 1 (ONE) TABLET 30 MIN BEFORE MEALS AND BEDTIME 10/10/20   [provider]  trolamine salicylate (ASPERCREME) 10 % cream Apply 1 application topically daily as needed (on feet).    [provider]    Allergies Ciprofloxacin-ciproflox hcl er, Sulfa antibiotics, Other, and Rizatriptan  No family history on file.  Social History Social History   Tobacco Use   Smoking status: Never   Smokeless tobacco: Never  Vaping Use   Vaping Use: Every day   Substances: Nicotine, CBD   Devices: Freemax (started approx 2017)  Substance Use Topics   Alcohol use: No   Drug use: Not Currently    Types: Cocaine    Review of Systems Constitutional: No fever/chills Eyes: No visual changes.  ENT: No sore throat. Cardiovascular: Denies chest pain. Respiratory: Denies shortness of breath. Gastrointestinal: No abdominal pain.  No nausea, no vomiting.  No diarrhea.  No constipation. Genitourinary: Negative for dysuria. Musculoskeletal: Negative for neck pain.  Negative for back pain. Integumentary: Negative for rash. Neurological: Negative for headaches, focal weakness or numbness.   ____________________________________________   PHYSICAL EXAM:  VITAL SIGNS: ED Triage Vitals  Enc Vitals Group     BP 10/21/21 2109 115/90     Pulse Rate 10/21/21 2109 82     Resp 10/21/21 2109 20     Temp 10/21/21 2109 98.2 F (36.8 C)     Temp Source 10/21/21 2109 Oral     SpO2 10/21/21 2109 99 %     Weight 10/21/21 2110 81.6 kg (180 lb)     Height 10/21/21 2110 1.753 m (5\' 9" )     Head Circumference --      Peak Flow --      Pain Score 10/21/21 2110 10     Pain Loc --      Pain Edu? --      Excl. in GC? --     Constitutional: Alert and oriented.  Eyes: Conjunctivae are normal.  Pupils are equal and reactive bilaterally. Head: Atraumatic. Nose: No  congestion/rhinnorhea. Mouth/Throat: Patient is wearing a mask. Neck: No stridor.  No meningeal signs.   Cardiovascular: Normal rate, regular rhythm. Good peripheral circulation. Respiratory: Normal respiratory effort.  No retractions. Gastrointestinal: Soft and nontender. No distention.  Musculoskeletal: No lower extremity tenderness nor edema. No gross deformities of extremities. Neurologic:  Normal speech and language. No gross focal neurologic deficits are appreciated.  Skin:  Skin is warm, dry and intact. Psychiatric: Mood and affect are normal. Speech and behavior are normal.  ____________________________________________   LABS (all labs ordered are listed, but only abnormal results are displayed)  Labs Reviewed  CBC - Abnormal; Notable for the following components:      Result Value   Hemoglobin 11.9 (*)    All other components within normal limits  COMPREHENSIVE METABOLIC PANEL  TROPONIN I (HIGH SENSITIVITY)   ____________________________________________  EKG  ED ECG REPORT I, 2111, the attending physician, personally viewed and interpreted this ECG.  Date: 10/21/2021 EKG Time: 21: 14 Rate: 74 Rhythm: normal sinus rhythm QRS Axis: normal Intervals: normal ST/T Wave abnormalities: normal Narrative Interpretation: no evidence of acute ischemia  ____________________________________________   INITIAL IMPRESSION / MDM / ASSESSMENT AND PLAN / ED COURSE  As part of my medical decision making, I reviewed the following data within the electronic MEDICAL RECORD NUMBER Nursing notes reviewed and incorporated, Labs reviewed , EKG interpreted , Old chart reviewed, Notes from prior ED visits, and [place] Controlled Substance Database   Differential diagnosis includes, but is not limited to, migraine, cluster headache, stress headache, nonspecific headache, sinus thrombosis, pseudotumor cerebri.  Vital signs are stable and within normal limits.  No sign of ischemia on EKG and  intervals are normal including QTc.  Physical exam is reassuring with no focal neurological deficits or other concerning or abnormal findings.  Comprehensive metabolic panel, initial high-sensitivity troponin, and CBC are all within normal limits.  I discussed migraine treatment with the patient and she agrees with empiric treatment to determine if any other intervention will be necessary but I do not feel there is a role for imaging at this time.  I will proceed with the following parenteral medications: Droperidol 2.5 mg IV, diphenhydramine 12.5 mg IV, ketorolac 15  mg IV, dexamethasone 10 mg IV, and a normal saline 500 mL bolus.  I will reassess after the medication administration.       Clinical Course as of 10/22/21 0108  Thu Oct 22, 2021  [id number] Patient says she feels much better after the medication wants to go home.  No indication of an emergent medical condition at this time.  I gave my usual and customary return precautions. [CF]    Clinical Course User Index [CF] Hinda Kehr, MD     ____________________________________________  FINAL CLINICAL IMPRESSION(S) / ED DIAGNOSES  Final diagnoses:  Migraine without status migrainosus, not intractable, unspecified migraine type     MEDICATIONS GIVEN DURING THIS VISIT:  Medications  droperidol (INAPSINE) 2.5 MG/ML injection 2.5 mg (2.5 mg Intravenous Given 10/21/21 2323)  diphenhydrAMINE (BENADRYL) injection 12.5 mg (12.5 mg Intravenous Given 10/21/21 2321)  ketorolac (TORADOL) 30 MG/ML injection 15 mg (15 mg Intravenous Given 10/21/21 2321)  sodium chloride 0.9 % bolus 500 mL (0 mLs Intravenous Stopped 10/21/21 2356)  dexamethasone (DECADRON) injection 10 mg (10 mg Intravenous Given 10/21/21 2322)     ED Discharge Orders     None        Note:  This document was prepared using Dragon voice recognition software and may include unintentional dictation errors.   Hinda Kehr, MD 10/22/21 [phone]

## 2021-10-21 NOTE — ED Triage Notes (Signed)
Pt in with co migraine since Monday hx of the same. States has taken vicodin, and Excedrin migraine without success. Pt states started having palpitations yesterday.

## 2021-10-21 NOTE — ED Notes (Signed)
Pt provided non-slip socks and ambulated to the restroom without assistance.

## 2021-10-21 NOTE — ED Notes (Addendum)
Pt returned.  Labs re-drawn and sent to lab.  Pt advised to stay and be seen by provider.  Pt agreeable

## 2021-10-22 NOTE — ED Notes (Signed)
E-signature pad unavailable - Pt verbalized understanding of D/C information - no additional concerns at this time.  

## 2021-10-22 NOTE — Discharge Instructions (Signed)

## 2022-01-03 IMAGING — CR DG CHEST 2V
2 series · 2 of 2 positions shown · non-contrast
Comparison: 11/24/2010

CLINICAL DATA: 29-year-old female with a history of chest pain

EXAM:
CHEST - 2 VIEW

[chest pa]
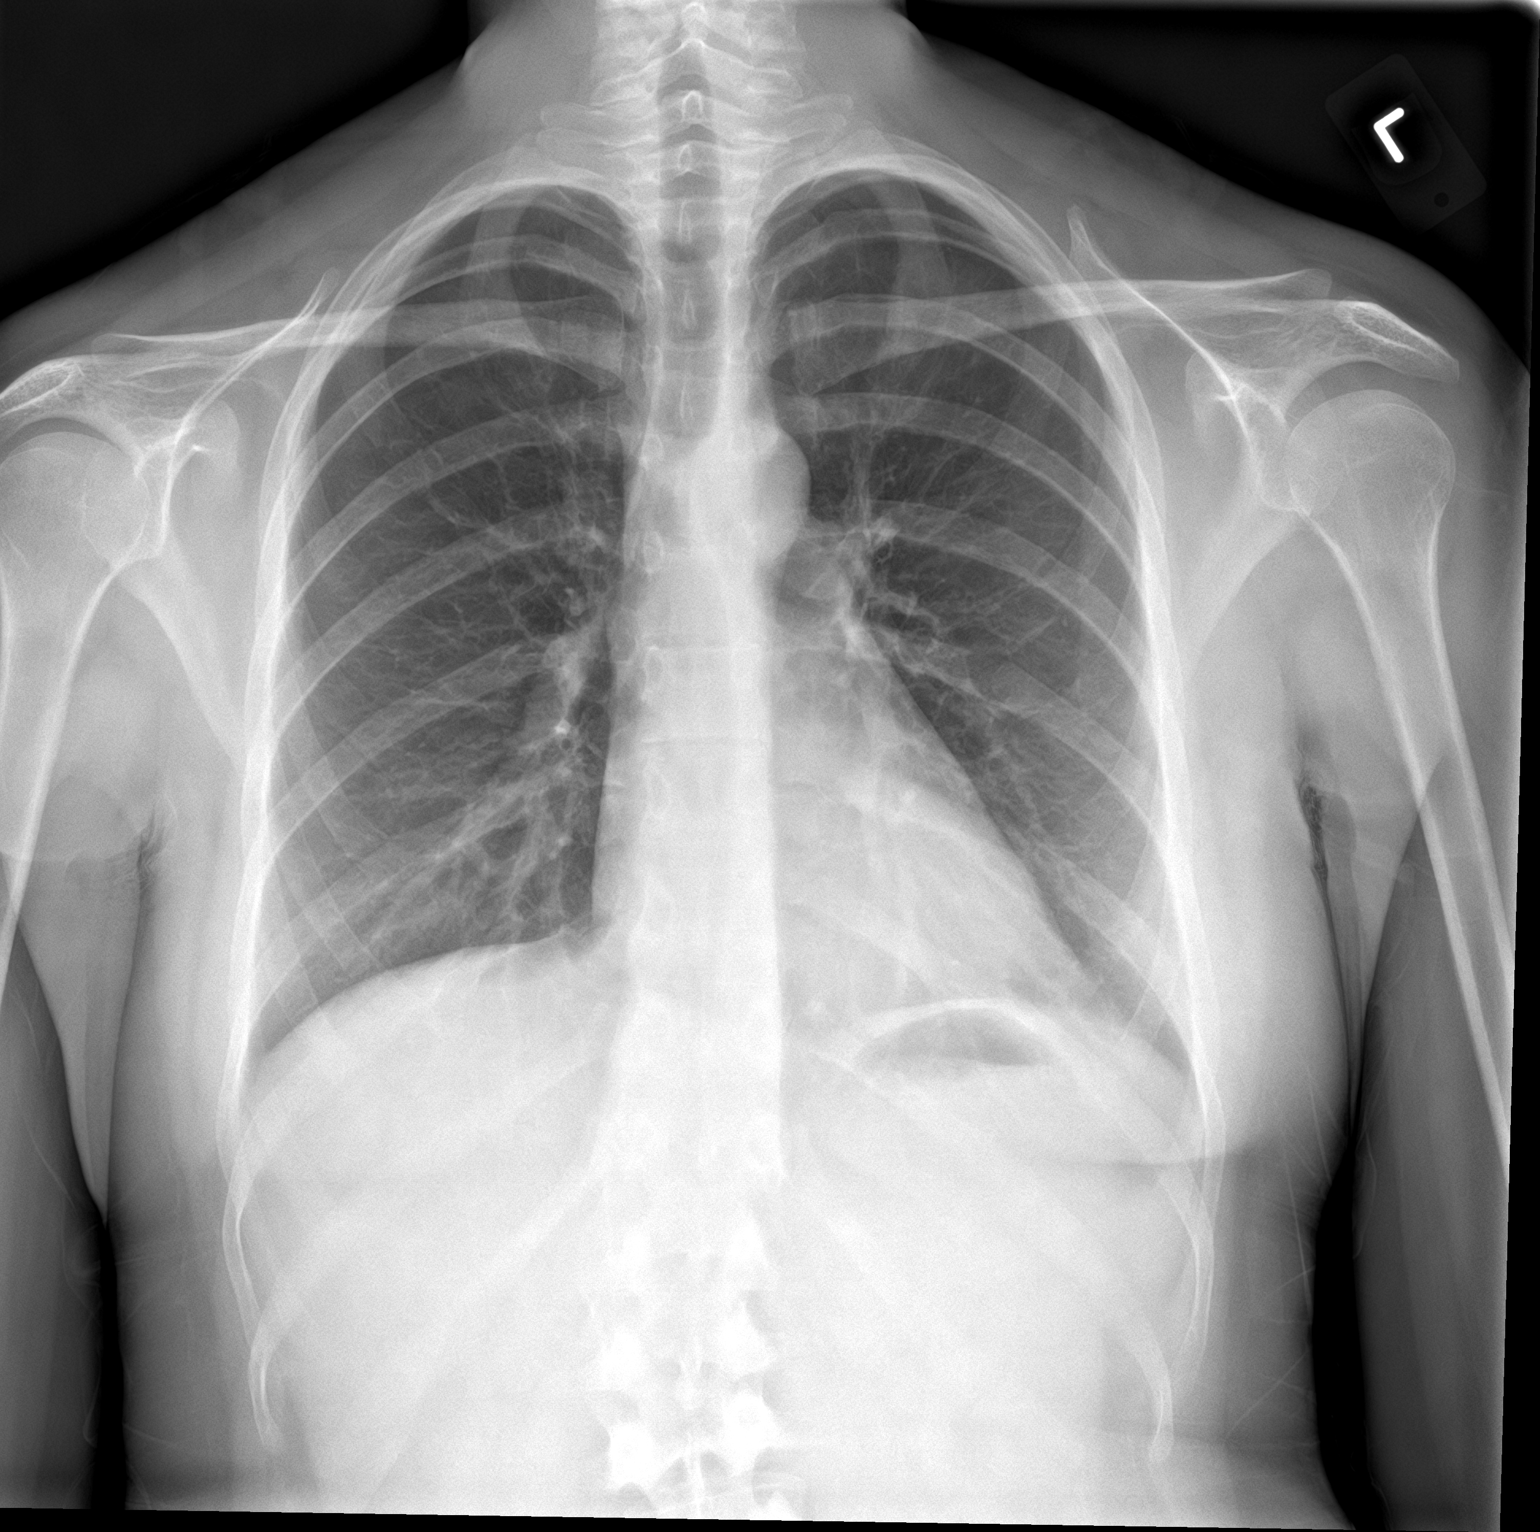

[chest lat]
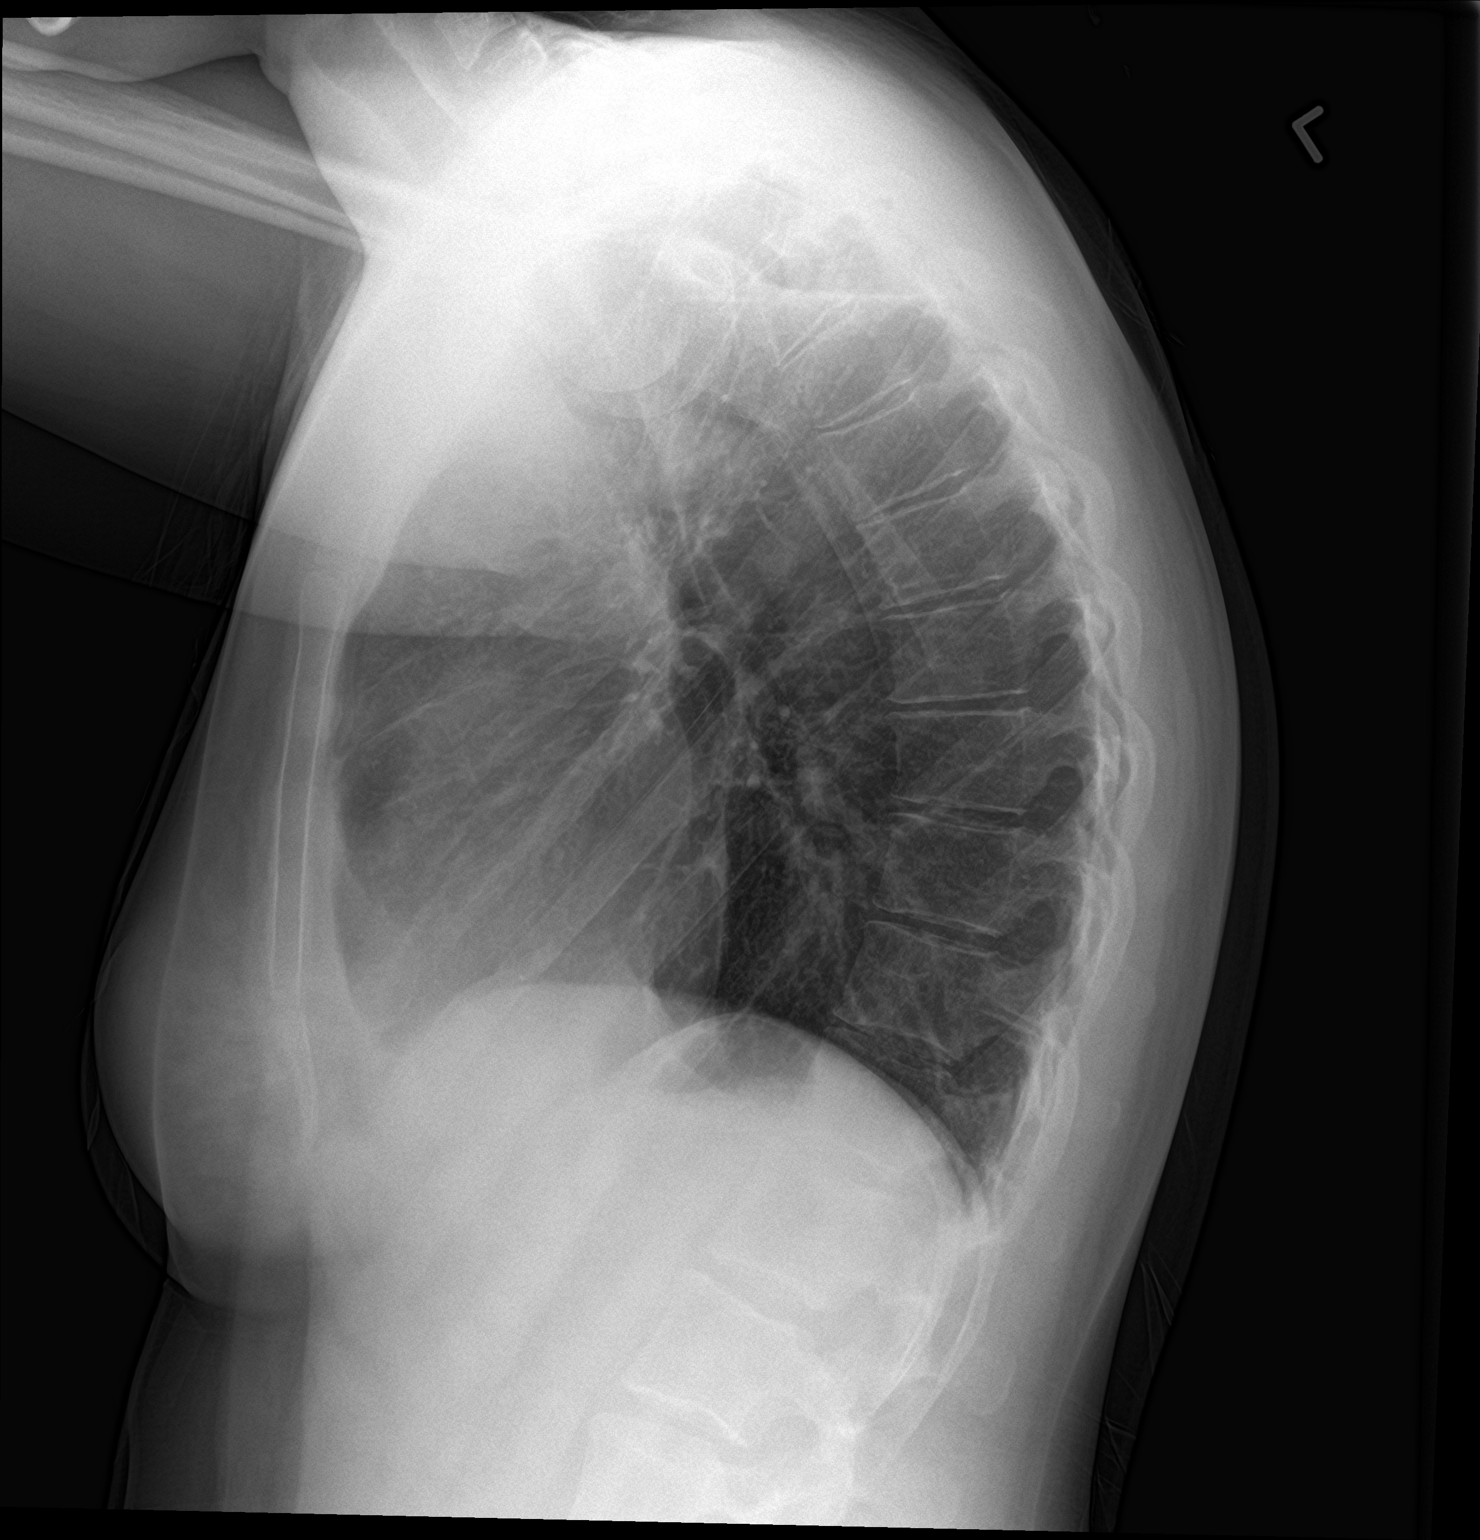

[2 of 2 positions shown; findings below may reference images not displayed]

FINDINGS: The heart size and mediastinal contours are within normal limits.
Both lungs are clear. The visualized skeletal structures are
unremarkable.
IMPRESSION: Negative for acute cardiopulmonary disease.

## 2022-01-12 ENCOUNTER — Emergency Department
Admission: EM | Admit: 2022-01-12 | Discharge: 2022-01-12 | Disposition: A | Discharge status: Home / Self Care | Payer: Medicaid Other | Attending: Emergency Medicine | Admitting: Emergency Medicine

## 2022-01-12 ENCOUNTER — Other Ambulatory Visit: Payer: Self-pay

## 2022-01-12 DIAGNOSIS — Z20822 Contact with and (suspected) exposure to covid-19: Secondary | ICD-10-CM | POA: Diagnosis not present

## 2022-01-12 DIAGNOSIS — G43001 Migraine without aura, not intractable, with status migrainosus: Secondary | ICD-10-CM | POA: Diagnosis not present

## 2022-01-12 DIAGNOSIS — R519 Headache, unspecified: Secondary | ICD-10-CM | POA: Diagnosis present

## 2022-01-12 LAB — CBC WITH DIFFERENTIAL/PLATELET
Abs Immature Granulocytes: 0.05 10*3/uL (ref 0.00–0.07)
Basophils Absolute: 0.1 10*3/uL (ref 0.0–0.1)
Basophils Relative: 1 %
Eosinophils Absolute: 0.2 10*3/uL (ref 0.0–0.5)
Eosinophils Relative: 2 %
HCT: 37.5 % (ref 36.0–46.0)
Hemoglobin: 11.5 g/dL — ABNORMAL LOW (ref 12.0–15.0)
Immature Granulocytes: 1 %
Lymphocytes Relative: 24 %
Lymphs Abs: 2.1 10*3/uL (ref 0.7–4.0)
MCH: 26.1 pg (ref 26.0–34.0)
MCHC: 30.7 g/dL (ref 30.0–36.0)
MCV: 85.2 fL (ref 80.0–100.0)
Monocytes Absolute: 0.8 10*3/uL (ref 0.1–1.0)
Monocytes Relative: 9 %
Neutro Abs: 5.5 10*3/uL (ref 1.7–7.7)
Neutrophils Relative %: 63 %
Platelets: 379 10*3/uL (ref 150–400)
RBC: 4.4 MIL/uL (ref 3.87–5.11)
RDW: 16.7 % — ABNORMAL HIGH (ref 11.5–15.5)
WBC: 8.8 10*3/uL (ref 4.0–10.5)
nRBC: 0 % (ref 0.0–0.2)

## 2022-01-12 LAB — BASIC METABOLIC PANEL
Anion gap: 6 (ref 5–15)
BUN: 10 mg/dL (ref 6–20)
CO2: 27 mmol/L (ref 22–32)
Calcium: 9 mg/dL (ref 8.9–10.3)
Chloride: 106 mmol/L (ref 98–111)
Creatinine, Ser: 0.69 mg/dL (ref 0.44–1.00)
GFR, Estimated: >60 mL/min (ref >60)
Glucose, Bld: 102 mg/dL — ABNORMAL HIGH (ref 70–99)
Potassium: 4.2 mmol/L (ref 3.5–5.1)
Sodium: 139 mmol/L (ref 135–145)

## 2022-01-12 LAB — RESP PANEL BY RT-PCR (FLU A&B, COVID) ARPGX2
Influenza A by PCR: NEGATIVE
Influenza B by PCR: NEGATIVE
SARS Coronavirus 2 by RT PCR: NEGATIVE

## 2022-01-12 MED ORDER — PROCHLORPERAZINE EDISYLATE 10 MG/2ML IJ SOLN: 10.0000 mg | Freq: Once | INTRAMUSCULAR | Status: DC

## 2022-01-12 MED ORDER — ONDANSETRON HCL 4 MG/2ML IJ SOLN: 4.0000 mg | Freq: Once | INTRAMUSCULAR | Status: DC

## 2022-01-12 MED ORDER — KETOROLAC TROMETHAMINE 30 MG/ML IJ SOLN
30.0000 mg | Freq: Once | INTRAMUSCULAR | Status: AC
  Administered 2022-01-12: 30 mg via INTRAVENOUS
  Filled 2022-01-12: qty 1

## 2022-01-12 MED ORDER — METOCLOPRAMIDE HCL 5 MG/ML IJ SOLN
10.0000 mg | Freq: Once | INTRAMUSCULAR | Status: AC
Start: 2022-01-12 — End: 2022-01-12
  Administered 2022-01-12: 10 mg via INTRAVENOUS
  Filled 2022-01-12: qty 2

## 2022-01-12 MED ORDER — SODIUM CHLORIDE 0.9 % IV BOLUS
1000.0000 mL | Freq: Once | INTRAVENOUS | Status: AC
  Administered 2022-01-12: 1000 mL via INTRAVENOUS

## 2022-01-12 NOTE — Discharge Instructions (Signed)
Follow-up with your regular doctor if not improving 3 days.  Return emergency department worsening.  Drink plenty of fluids. ?

## 2022-01-12 NOTE — ED Triage Notes (Signed)
First Nurse Note:  C/O Migraine headache x 1 day.  States feeling foggy at times, has to sleep otherwise the pain is to bad. ? ?Patient is AAOx3.  Skin warm and dry. NAD.  Ambulates with easy and steady gait. ?

## 2022-01-12 NOTE — ED Provider Triage Note (Signed)
?  Emergency Medicine Provider Triage Evaluation Note ? ?Regina [name] , a 31 y.o.female,  was evaluated in triage.  Pt complains of migraines.  Patient states that her headache began yesterday.  Endorses mild memory issues and foggy vision.  She states that this is consistent with her previous migraines, however more severe than usual.  ? ? ?Review of Systems  ?Positive: Headache ?Negative: Denies fever, chest pain, vomiting ? ?Physical Exam  ? ?Vitals:  ? 01/12/22 1247  ?BP: (!) 130/96  ?Pulse: (!) 102  ?Resp: 17  ?SpO2: 99%  ? ?Gen:   Awake, no distress   ?Resp:  Normal effort  ?MSK:   Moves extremities without difficulty  ?Other:  Cranial nerves II through XII intact.  Normal strength and sensation in all extremities. ? ?Medical Decision Making  ?Given the patient's initial medical screening exam, the following diagnostic evaluation has been ordered. The patient will be placed in the appropriate treatment space, once one is available, to complete the evaluation and treatment. I have discussed the plan of care with the patient and I have advised the patient that an ED physician or mid-level practitioner will reevaluate their condition after the test results have been received, as the results may give them additional insight into the type of treatment they may need.  ? ? ?Diagnostics: Labs, ? ?Treatments: Compazine, ondansetron. ?  ?Teodoro Spray, [place] ?01/12/22 1254 ? ?

## 2022-01-12 NOTE — ED Triage Notes (Signed)
Pt c/o having one of her chronic migraines that started yesterday, states she is having some memory issues like remembering her phone number or SSN, states I feel foggy, fatigued, wanting to just sleep, states she has taken IBU and B/C powder with no relief. ?

## 2022-01-12 NOTE — ED Provider Notes (Signed)
? ?[facility] ?Provider Note ? ? ? Event Date/Time  ? First MD Initiated Contact with Patient 01/12/22 1303   ?  (approximate) ? ? ?History  ? ?Migraine ? ? ?HPI ? ?Regina [name] is a 31 y.o. female with history of migraines presents emergency department with a headache.  Patient states it is different than her normal migraines.  Usually gets Botox injections for her headaches.  Will take medication on and off.  She states she has also had a cough, felt hot, no known fever.  No sore throat.  No dysuria. ? ?  ? ? ?Physical Exam  ? ?Triage Vital Signs: ?ED Triage Vitals  ?Enc Vitals Group  ?   BP 01/12/22 1247 (!) 130/96  ?   Pulse Rate 01/12/22 1247 (!) 102  ?   Resp 01/12/22 1247 17  ?   Temp 01/12/22 1303 97.9 ?F (36.6 ?C)  ?   Temp Source 01/12/22 1247 Oral  ?   SpO2 01/12/22 1247 99 %  ?   Weight --   ?   Height --   ?   Head Circumference --   ?   Peak Flow --   ?   Pain Score --   ?   Pain Loc --   ?   Pain Edu? --   ?   Excl. in GC? --   ? ? ?Most recent vital signs: ?Vitals:  ? 01/12/22 1247 01/12/22 1303  ?BP: (!) 130/96   ?Pulse: (!) 102   ?Resp: 17   ?Temp:  97.9 ?F (36.6 ?C)  ?SpO2: 99%   ? ? ? ?General: Awake, no distress.   ?CV:  Good peripheral perfusion. regular rate and  rhythm ?Resp:  Normal effort. Lungs CTA ?Abd:  No distention.   ?Other:  Cranial nerves II through XII grossly intact, patient is alert and able to answer all questions, no slurred speech or strokelike symptoms. ? ? ?ED Results / Procedures / Treatments  ? ?Labs ?(all labs ordered are listed, but only abnormal results are displayed) ?Labs Reviewed  ?BASIC METABOLIC PANEL - Abnormal; Notable for the following components:  ?    Result Value  ? Glucose, Bld 102 (*)   ? All other components within normal limits  ?CBC WITH DIFFERENTIAL/PLATELET - Abnormal; Notable for the following components:  ? Hemoglobin 11.5 (*)   ? RDW 16.7 (*)   ? All other components within normal limits  ?RESP PANEL BY RT-PCR (FLU A&B,  COVID) ARPGX2  ?URINALYSIS, COMPLETE (UACMP) WITH MICROSCOPIC  ?POC URINE PREG, ED  ? ? ? ?EKG ? ? ? ? ?RADIOLOGY ? ? ? ? ?PROCEDURES: ? ? ?Procedures ? ? ?MEDICATIONS ORDERED IN ED: ?Medications  ?metoCLOPramide (REGLAN) injection 10 mg (10 mg Intravenous Given 01/12/22 1335)  ?sodium chloride 0.9 % bolus 1,000 mL (1,000 mLs Intravenous New Bag/Given 01/12/22 1334)  ?ketorolac (TORADOL) 30 MG/ML injection 30 mg (30 mg Intravenous Given 01/12/22 1335)  ? ? ? ?IMPRESSION / MDM / ASSESSMENT AND PLAN / ED COURSE  ?I reviewed the triage vital signs and the nursing notes. ?             ?               ? ?Differential diagnosis includes, but is not limited to, chronic migraine, COVID, sinus, subdural, SAH ? ?Patient has a history of chronic migraines I do not believe that she has subdural or SAH.  She is alert and able  to answer all questions and cranial nerves II through XII grossly intact.  Referral will not order CT immediately. ? ?We will give the patient a migraine cocktail to include Reglan, Toradol, fluids.  Will reassess after medication. ? ?----------------------------------------- ?2:28 PM on 01/12/2022 ?----------------------------------------- ?Patient states she is feeling much better after the Reglan and Toradol.  Is still getting fluids at this time.  Is asking for ginger ale to drink.  Do feel the patient's had improvement so we will not do a CT at this time.  Labs have been reassuring, CBC, metabolic panel are all negative.  Respiratory panel is pending but patient can see these results on her [facility] MyChart.  Discharged in stable condition. ? ? ?  ? ? ?FINAL CLINICAL IMPRESSION(S) / ED DIAGNOSES  ? ?Final diagnoses:  ?Migraine without aura and with status migrainosus, not intractable  ? ? ? ?Rx / DC Orders  ? ?ED Discharge Orders   ? ? None  ? ?  ? ? ? ?Note:  This document was prepared using Dragon voice recognition software and may include unintentional dictation errors. ? ?  ?Faythe Ghee,  PA-C ?01/12/22 1430 ? ?  ?Jene Every, MD ?01/12/22 1437 ? ?

## 2022-01-27 ENCOUNTER — Emergency Department
Admission: EM | Admit: 2022-01-27 | Discharge: 2022-01-27 | Discharge status: Against Medical Advice | Payer: Medicaid Other | Attending: Emergency Medicine | Admitting: Emergency Medicine

## 2022-01-27 NOTE — ED Notes (Signed)
Called several times for triage with no answer; no answer when phone # in chart called ?

## 2022-01-27 NOTE — ED Notes (Signed)
No answer when called several times from lobby 

## 2022-04-06 ENCOUNTER — Emergency Department: Payer: Medicaid Other

## 2022-04-06 ENCOUNTER — Other Ambulatory Visit: Payer: Self-pay

## 2022-04-06 ENCOUNTER — Emergency Department
Admission: EM | Admit: 2022-04-06 | Discharge: 2022-04-06 | Disposition: A | Discharge status: Home / Self Care | Payer: Medicaid Other | Attending: Emergency Medicine | Admitting: Emergency Medicine

## 2022-04-06 DIAGNOSIS — R10817 Generalized abdominal tenderness: Secondary | ICD-10-CM | POA: Diagnosis not present

## 2022-04-06 DIAGNOSIS — R109 Unspecified abdominal pain: Secondary | ICD-10-CM | POA: Diagnosis present

## 2022-04-06 LAB — COMPREHENSIVE METABOLIC PANEL
ALT: 13 U/L (ref 0–44)
AST: 19 U/L (ref 15–41)
Albumin: 4 g/dL (ref 3.5–5.0)
Alkaline Phosphatase: 75 U/L (ref 38–126)
Anion gap: 8 (ref 5–15)
BUN: 8 mg/dL (ref 6–20)
CO2: 23 mmol/L (ref 22–32)
Calcium: 9.3 mg/dL (ref 8.9–10.3)
Chloride: 107 mmol/L (ref 98–111)
Creatinine, Ser: 0.59 mg/dL (ref 0.44–1.00)
GFR, Estimated: >60 mL/min (ref >60)
Glucose, Bld: 112 mg/dL — ABNORMAL HIGH (ref 70–99)
Potassium: 3.8 mmol/L (ref 3.5–5.1)
Sodium: 138 mmol/L (ref 135–145)
Total Bilirubin: 0.4 mg/dL (ref 0.3–1.2)
Total Protein: 7.7 g/dL (ref 6.5–8.1)

## 2022-04-06 LAB — CBC
HCT: 38.6 % (ref 36.0–46.0)
Hemoglobin: 12 g/dL (ref 12.0–15.0)
MCH: 26.4 pg (ref 26.0–34.0)
MCHC: 31.1 g/dL (ref 30.0–36.0)
MCV: 85 fL (ref 80.0–100.0)
Platelets: 367 10*3/uL (ref 150–400)
RBC: 4.54 MIL/uL (ref 3.87–5.11)
RDW: 14.8 % (ref 11.5–15.5)
WBC: 7.1 10*3/uL (ref 4.0–10.5)
nRBC: 0 % (ref 0.0–0.2)

## 2022-04-06 LAB — LIPASE, BLOOD: Lipase: 31 U/L (ref 11–51)

## 2022-04-06 LAB — URINALYSIS, ROUTINE W REFLEX MICROSCOPIC
Bilirubin Urine: NEGATIVE
Glucose, UA: NEGATIVE mg/dL
Hgb urine dipstick: NEGATIVE
Ketones, ur: NEGATIVE mg/dL
Leukocytes,Ua: NEGATIVE
Nitrite: NEGATIVE
Protein, ur: NEGATIVE mg/dL
Specific Gravity, Urine: 1.002 — ABNORMAL LOW (ref 1.005–1.030)
pH: 8 (ref 5.0–8.0)

## 2022-04-06 LAB — POC URINE PREG, ED: Preg Test, Ur: NEGATIVE

## 2022-04-06 MED ORDER — ONDANSETRON HCL 4 MG/2ML IJ SOLN
4.0000 mg | Freq: Once | INTRAMUSCULAR | Status: AC
  Administered 2022-04-06: 4 mg via INTRAVENOUS
  Filled 2022-04-06: qty 2

## 2022-04-06 MED ORDER — SODIUM CHLORIDE 0.9 % IV BOLUS
1000.0000 mL | Freq: Once | INTRAVENOUS | Status: AC
  Administered 2022-04-06: 1000 mL via INTRAVENOUS

## 2022-04-06 MED ORDER — DICYCLOMINE HCL 10 MG PO CAPS: 10.0000 mg | ORAL_CAPSULE | Freq: Three times a day (TID) | ORAL | 0 refills | Status: AC | PRN

## 2022-04-06 MED ORDER — KETOROLAC TROMETHAMINE 30 MG/ML IJ SOLN
30.0000 mg | Freq: Once | INTRAMUSCULAR | Status: AC
  Administered 2022-04-06: 30 mg via INTRAVENOUS
  Filled 2022-04-06: qty 1

## 2022-04-06 MED ORDER — IOHEXOL 300 MG/ML  SOLN
100.0000 mL | Freq: Once | INTRAMUSCULAR | Status: AC | PRN
  Administered 2022-04-06: 100 mL via INTRAVENOUS

## 2022-04-06 NOTE — ED Provider Notes (Signed)
[facility] Provider Note    Event Date/Time   First MD Initiated Contact with Patient 04/06/22 1256     (approximate)  History   Chief Complaint: Abdominal Pain  HPI  Regina [name] is a 31 y.o. female with a past medical history of ADHD, anxiety, IBS, presents to the emergency department for intermittent abdominal pains.  According to the patient over the past week or so she has intermittently been experiencing abdominal pain.  Patient states it is typical for her to have abdominal pain with her IBS however she took her hyoscyamine which typically relieves her abdominal discomfort, but it did not help.  Patient was concerned that something more concerning could be going on.  Patient denies any dysuria or hematuria no vaginal bleeding or discharge.  Physical Exam   Triage Vital Signs: ED Triage Vitals [04/06/22 1151]  Enc Vitals Group     BP (!) 116/93     Pulse Rate (!) 112     Resp 18     Temp 98.6 F (37 C)     Temp Source Oral     SpO2 96 %     Weight 223 lb 14.4 oz (101.6 kg)     Height 5\' 9"  (1.753 m)     Head Circumference      Peak Flow      Pain Score 10     Pain Loc      Pain Edu?      Excl. in [place]?     Most recent vital signs: Vitals:   04/06/22 1151  BP: (!) 116/93  Pulse: (!) 112  Resp: 18  Temp: 98.6 F (37 C)  SpO2: 96%    General: Awake, no distress.  CV:  Good peripheral perfusion.  Regular rate and rhythm  Resp:  Normal effort.  Equal breath sounds bilaterally.  Abd:  No distention.  Soft, mild diffuse abdominal tenderness to palpation.  No rebound or guarding.   ED Results / Procedures / Treatments    RADIOLOGY  I reviewed the CT images I do not see any significant abnormality on my interpretation. Radiology is read the CT scan is negative   MEDICATIONS ORDERED IN ED: Medications  ketorolac (TORADOL) 30 MG/ML injection 30 mg (has no administration in time range)  sodium chloride 0.9 % bolus 1,000 mL (has no  administration in time range)  ondansetron (ZOFRAN) injection 4 mg (has no administration in time range)     IMPRESSION / MDM / ASSESSMENT AND PLAN / ED COURSE  I reviewed the triage vital signs and the nursing notes.  Patient's presentation is most consistent with acute complicated illness / injury requiring diagnostic workup.  Patient presents emergency department for diffuse abdominal pain that she typically associates with IBS however no better after her home medications so the patient came to the emergency department for evaluation.  Patient's lab work is overall reassuring including normal CBC, normal chemistry, normal urinalysis with negative pregnancy test.  Normal/negative lipase.  We will proceed with CT imaging to further evaluate.  Patient agreeable to plan of care.  Patient's labs are reassuring, CBC chemistry and urine are normal.  Negative pregnancy test.  CT scan is negative as well.  Given the patient's reassuring work-up I believe the patient would be safe for discharge home with PCP follow-up.  Patient is agreeable to plan of care.  Discussed with the patient using Bentyl as needed for discomfort.  Patient agreeable to trial.  Provided my  normal abdominal pain return precautions  FINAL CLINICAL IMPRESSION(S) / ED DIAGNOSES   Abdominal pain  Rx / DC Orders   Bentyl  Note:  This document was prepared using Dragon voice recognition software and may include unintentional dictation errors.   Harvest Dark, MD 04/06/22 1416

## 2022-04-06 NOTE — ED Notes (Signed)
Pt to ED with husband for lower abdominal pain, sharp, since 1 week, feels bloated, feels nauseous, has had mild diarrhea, no vomiting.

## 2022-04-06 NOTE — ED Triage Notes (Signed)
Pt here with abd pain x1 week. Pt states pain is all over her lower abd. Pt also states she has some abd swelling that started today. Pt endorses nausea and diarrhea but no emesis.
